# Patient Record
Sex: Female | Born: 1960 | Race: White | Hispanic: No | Marital: Single | Smoking: Former smoker
Health system: Southern US, Community
[De-identification: ages and names within clinical notes are randomized; demographics above are authoritative.]

## PROBLEM LIST (undated history)

## (undated) DIAGNOSIS — K219 Gastro-esophageal reflux disease without esophagitis: Secondary | ICD-10-CM

## (undated) DIAGNOSIS — M5020 Other cervical disc displacement, unspecified cervical region: Secondary | ICD-10-CM

## (undated) DIAGNOSIS — R0600 Dyspnea, unspecified: Secondary | ICD-10-CM

## (undated) DIAGNOSIS — F411 Generalized anxiety disorder: Secondary | ICD-10-CM

## (undated) DIAGNOSIS — M5030 Other cervical disc degeneration, unspecified cervical region: Secondary | ICD-10-CM

## (undated) DIAGNOSIS — T4145XA Adverse effect of unspecified anesthetic, initial encounter: Secondary | ICD-10-CM

## (undated) DIAGNOSIS — J449 Chronic obstructive pulmonary disease, unspecified: Secondary | ICD-10-CM

## (undated) DIAGNOSIS — T8859XA Other complications of anesthesia, initial encounter: Secondary | ICD-10-CM

## (undated) DIAGNOSIS — F324 Major depressive disorder, single episode, in partial remission: Secondary | ICD-10-CM

## (undated) DIAGNOSIS — A159 Respiratory tuberculosis unspecified: Secondary | ICD-10-CM

## (undated) DIAGNOSIS — R519 Headache, unspecified: Secondary | ICD-10-CM

## (undated) HISTORY — DX: Major depressive disorder, single episode, in partial remission: F32.4

## (undated) HISTORY — PX: TONSILLECTOMY: SUR1361

---
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
## 1999-02-19 HISTORY — PX: TOTAL ABDOMINAL HYSTERECTOMY: SHX209

## 1999-02-19 HISTORY — PX: PARTIAL HYSTERECTOMY: SHX80

## 2004-11-15 ENCOUNTER — Other Ambulatory Visit: Payer: Self-pay

## 2004-11-15 ENCOUNTER — Emergency Department: Payer: Self-pay | Admitting: Internal Medicine

## 2004-11-22 ENCOUNTER — Ambulatory Visit: Payer: Self-pay | Admitting: Cardiovascular Disease

## 2010-02-18 HISTORY — PX: NEPHROLITHOTOMY: SUR881

## 2010-08-23 ENCOUNTER — Ambulatory Visit: Payer: Self-pay | Admitting: Internal Medicine

## 2010-08-28 ENCOUNTER — Ambulatory Visit: Payer: Self-pay | Admitting: Urology

## 2010-09-10 ENCOUNTER — Other Ambulatory Visit: Payer: Self-pay | Admitting: Urology

## 2010-09-10 DIAGNOSIS — N200 Calculus of kidney: Secondary | ICD-10-CM

## 2010-10-01 ENCOUNTER — Encounter (HOSPITAL_COMMUNITY): Payer: 59

## 2010-10-01 ENCOUNTER — Other Ambulatory Visit: Payer: Self-pay | Admitting: Urology

## 2010-10-01 LAB — SURGICAL PCR SCREEN
MRSA, PCR: NEGATIVE
Staphylococcus aureus: NEGATIVE

## 2010-10-01 LAB — CBC
HCT: 38.3 % (ref 36.0–46.0)
Hemoglobin: 12.7 g/dL (ref 12.0–15.0)
MCH: 31.8 pg (ref 26.0–34.0)
MCHC: 33.2 g/dL (ref 30.0–36.0)
RBC: 4 MIL/uL (ref 3.87–5.11)

## 2010-10-08 ENCOUNTER — Ambulatory Visit (HOSPITAL_COMMUNITY): Payer: 59

## 2010-10-08 ENCOUNTER — Ambulatory Visit (HOSPITAL_COMMUNITY)
Admission: RE | Admit: 2010-10-08 | Discharge: 2010-10-08 | Disposition: A | Discharge status: Home / Self Care | Payer: 59 | Source: Ambulatory Visit | Attending: Urology | Admitting: Urology

## 2010-10-08 ENCOUNTER — Observation Stay (HOSPITAL_COMMUNITY)
Admission: AD | Admit: 2010-10-08 | Discharge: 2010-10-10 | Disposition: A | Discharge status: Home / Self Care | Payer: 59 | Source: Ambulatory Visit | Attending: Urology | Admitting: Urology

## 2010-10-08 DIAGNOSIS — Z79899 Other long term (current) drug therapy: Secondary | ICD-10-CM | POA: Insufficient documentation

## 2010-10-08 DIAGNOSIS — R109 Unspecified abdominal pain: Secondary | ICD-10-CM | POA: Insufficient documentation

## 2010-10-08 DIAGNOSIS — N200 Calculus of kidney: Secondary | ICD-10-CM

## 2010-10-08 DIAGNOSIS — Z01812 Encounter for preprocedural laboratory examination: Secondary | ICD-10-CM | POA: Insufficient documentation

## 2010-10-08 DIAGNOSIS — M545 Low back pain, unspecified: Secondary | ICD-10-CM | POA: Insufficient documentation

## 2010-10-08 MED ORDER — IOHEXOL 300 MG/ML  SOLN
50.0000 mL | Freq: Once | INTRAMUSCULAR | Status: AC | PRN
  Administered 2010-10-08: 15 mL via INTRATHECAL

## 2010-10-09 ENCOUNTER — Observation Stay (HOSPITAL_COMMUNITY): Payer: 59

## 2010-10-09 LAB — CBC
HCT: 35.8 % — ABNORMAL LOW (ref 36.0–46.0)
Hemoglobin: 12 g/dL (ref 12.0–15.0)
MCV: 95.2 fL (ref 78.0–100.0)
RBC: 3.76 MIL/uL — ABNORMAL LOW (ref 3.87–5.11)
RDW: 12.9 % (ref 11.5–15.5)
WBC: 13.4 10*3/uL — ABNORMAL HIGH (ref 4.0–10.5)

## 2010-10-09 LAB — BASIC METABOLIC PANEL
BUN: 8 mg/dL (ref 6–23)
CO2: 26 mEq/L (ref 19–32)
Chloride: 100 mEq/L (ref 96–112)
GFR calc Af Amer: >60 mL/min (ref >60)
Glucose, Bld: 145 mg/dL — ABNORMAL HIGH (ref 70–99)
Potassium: 4 mEq/L (ref 3.5–5.1)

## 2010-10-10 NOTE — Op Note (Signed)
NAMESAANYA, Denise Fernandez NO.:  [id number]  MEDICAL RECORD NO.:  [id number]  LOCATION:  1416                         FACILITY:  [facility]  PHYSICIAN:  Valetta Fuller, MD    DATE OF BIRTH:  02-07-1961  DATE OF PROCEDURE: DATE OF DISCHARGE:                              OPERATIVE REPORT   PREOPERATIVE DIAGNOSIS:  Left partial staghorn calculus.  POSTOPERATIVE DIAGNOSIS:  Left partial staghorn calculus.  PROCEDURE PERFORMED:  Left percutaneous nephrolithotomy.  SURGEON:  Valetta Fuller, MD  ASSISTANT:  Sharen Counter, MD from Interventional Radiology.  ANESTHESIA:  General endotracheal.  INDICATIONS:  Denise Fernandez is 50 years of age.  She was sent to [place] from Dr. Rosezetta Schlatter in [place], [place].  The patient had presented with some chronic intermittent left flank pain.  Evaluation in [place] revealed a partial staghorn calculus measuring, 1.3 x 3.0 cm and a secondary 1.3 cm stone.  Dr. Leonette Monarch felt that stone burden was accepted for lithotripsy and recommended percutaneous nephrolithotomy. For that reason, she was sent to our office.  She was initially evaluated by Jetta Lout, NP, and subsequently saw myself in consultation.  We concurred with the recommendation for a percutaneous approach given the stone burden.  We went over the pros and cons of this approach versus alternatives with the patient and she appeared to understand the advantages and disadvantages, potential risks.  She has been on suppressive therapy with ciprofloxacin and has been covered with perioperative antibiotics.  She has had placement of PAS compression boots.  Earlier today, the patient underwent successful left percutaneous nephrostomy tube placement by Interventional Radiology.  I have reviewed those accessed films.  The patient now presents for definitive surgery.  TECHNIQUE AND FINDINGS:  The patient was brought to the operating room where she had successful induction of  general endotracheal anesthesia. Foley catheter was placed sterilely on the field.  The patient was placed in prone position with all extremities carefully padded.  She was then prepped and draped in the usual manner.  Interventional Radiology was present initially.  Two wires were placed through the patient's percutaneous nephrostomy tube and directed to the bladder.  One was a working wire and one was a Museum/gallery conservator.  A fascial dilating balloon was utilized and placement of a renal sheath occurred without incident.  A large stone was encountered in the upper pole calix.  The stone appeared to be probably struvite in composition.  A combination ultrasonic and mechanical lithotriptor was used through the rigid nephroscope.  The stone was fractured into multitude of pieces.  The majority of stone material was suctioned out, drained around the scope through the sheath. Some larger fragments were removed with a three-pronged extractor.  The additional stone was encountered and removed as well.  At the completion of the procedure, we could see no obvious remaining residual stone fragments either visually or with fluoroscopic interpretation.  There were no obvious complications or problems.  Over one of the guidewires, an 18-French Councill Foley was placed as a nephrostomy tube.  Contrast was injected, it could be seen going down the ureter, indicating no obvious obstruction at the UPJ or  proximal ureter.  The nephrostomy tube was secured to the skin and hooked to drainage bag. The patient had no obvious complications or problems and was brought to recovery room in stable condition.     Valetta Fuller, MD     DSG/MEDQ  D:  10/08/2010  T:  10/09/2010  Job:  [id number]  cc:   Dr. Rich Brave, [facility]  Electronically Signed by Barron Alvine M.D. on 10/10/2010 08:41:44 AM

## 2010-10-15 NOTE — Discharge Summary (Signed)
  NAMEBRIAR, SWORD NO.:  [id number]  MEDICAL RECORD NO.:  [id number]  LOCATION:  1416                         FACILITY:  [facility]  PHYSICIAN:  Valetta Fuller, MD    DATE OF BIRTH:  [birth year]  DATE OF ADMISSION:  10/08/2010 DATE OF DISCHARGE:  10/10/2010                              DISCHARGE SUMMARY   DISCHARGE DIAGNOSIS:  Partial staghorn calculus left kidney.  PROCEDURE PERFORMED:  Percutaneous nephrolithotomy.  HISTORY OF PRESENT ILLNESS:  Ms. Pitones is a 50 years of age.  She had presented to an outside urologist with diagnosis of multiple left renal calculi.  She had an essence of partial staghorn stone with a total stone burden approximately 2 x 4 cm.  That physician recommended percutaneous nephrolithotomy and sent the patient to our practice for consideration of that procedure.  We felt that was appropriate treatment for her and full informed consent was obtained.  The patient subsequently came in the morning of October 08, 2010, to Interventional Radiology where uneventful insertion of a left nephrostomy tube was placed.  The patient was subsequently taken to the OR for formal percutaneous nephrolithotomy.  Again, multiple stones were encountered and it appeared that the stone had been completely removed.  No obvious intraoperative complications occurred.  The patient's postoperative course was otherwise unremarkable.  The patient had a CT scan the following morning, which showed some very tiny 1 mm fragments without evidence of urinoma or significant renal hematoma.  The patient's hemoglobin was stable status post procedure of 12.0.  Renal function was normal with a creatinine of 0.6.  The patient was having a moderate amount of flank pain and therefore, we elected to keep her additional night mostly for observation and pain management. On postoperative day #2, she was feeling better remained afebrile with stable vital signs.  She was voiding  well with the Foley removed.  Her nephrostomy tube was draining a light-to-medium pink urine.  Her nephrostomy tube was removed and dressings applied.  The patient will be discharged this afternoon.  DISPOSITION:  The patient will be discharged home.  There will be no change in her home medications.  Followup will be scheduled in our office for approximately 10-14 days and sooner if necessary.     Valetta Fuller, MD     DSG/MEDQ  D:  10/10/2010  T:  10/10/2010  Job:  [id number]  Electronically Signed by Barron Alvine M.D. on 10/15/2010 11:57:44 AM

## 2012-02-07 ENCOUNTER — Ambulatory Visit: Payer: Self-pay | Admitting: Emergency Medicine

## 2012-02-07 LAB — RAPID INFLUENZA A&B ANTIGENS

## 2012-12-02 ENCOUNTER — Ambulatory Visit: Payer: Self-pay | Admitting: Family Medicine

## 2013-01-07 ENCOUNTER — Ambulatory Visit: Payer: Self-pay | Admitting: Family Medicine

## 2013-06-18 IMAGING — CR DG ABDOMEN 1V
1 series · 2 of 2 positions shown · non-contrast
Comparison: none

REASON FOR EXAM: calculus of kidney - Send film with patient
COMMENTS:

[Series 1: view not recorded · 0.17mm/px · 2 of 2 slices shown]
[im 1/2]
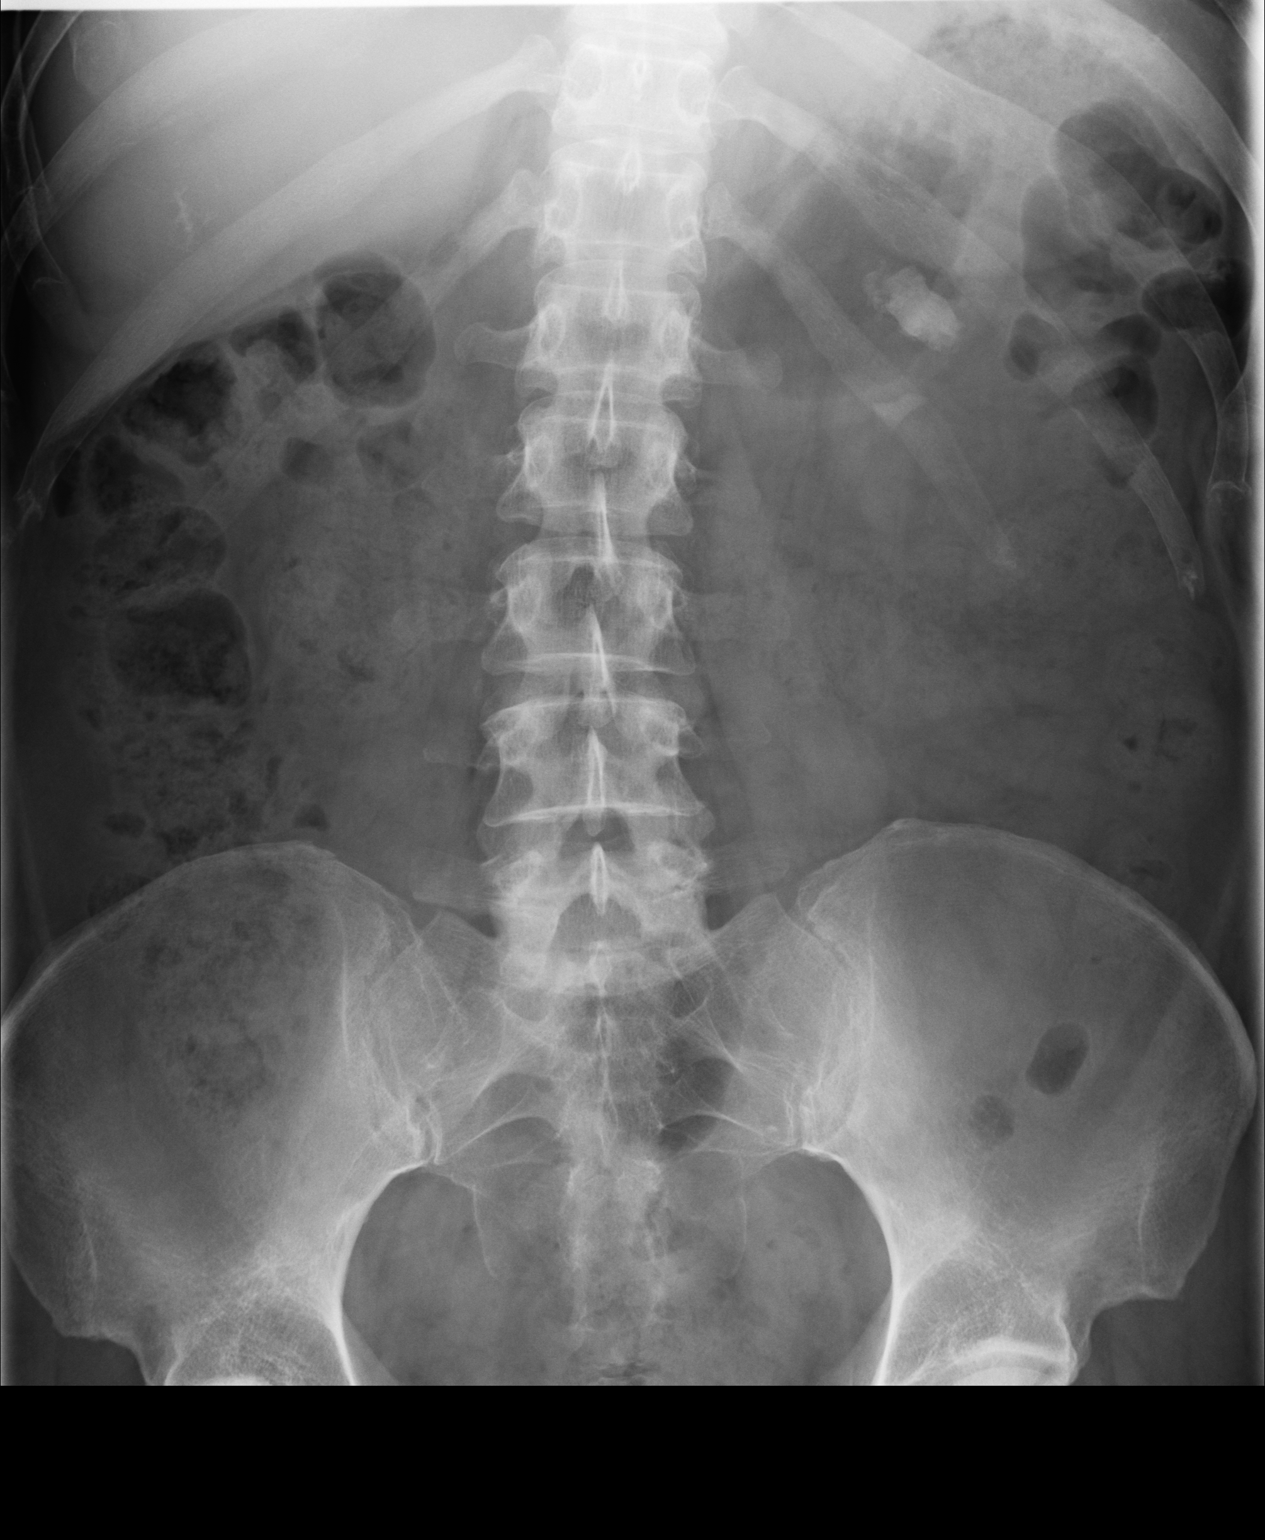
[im 2/2]
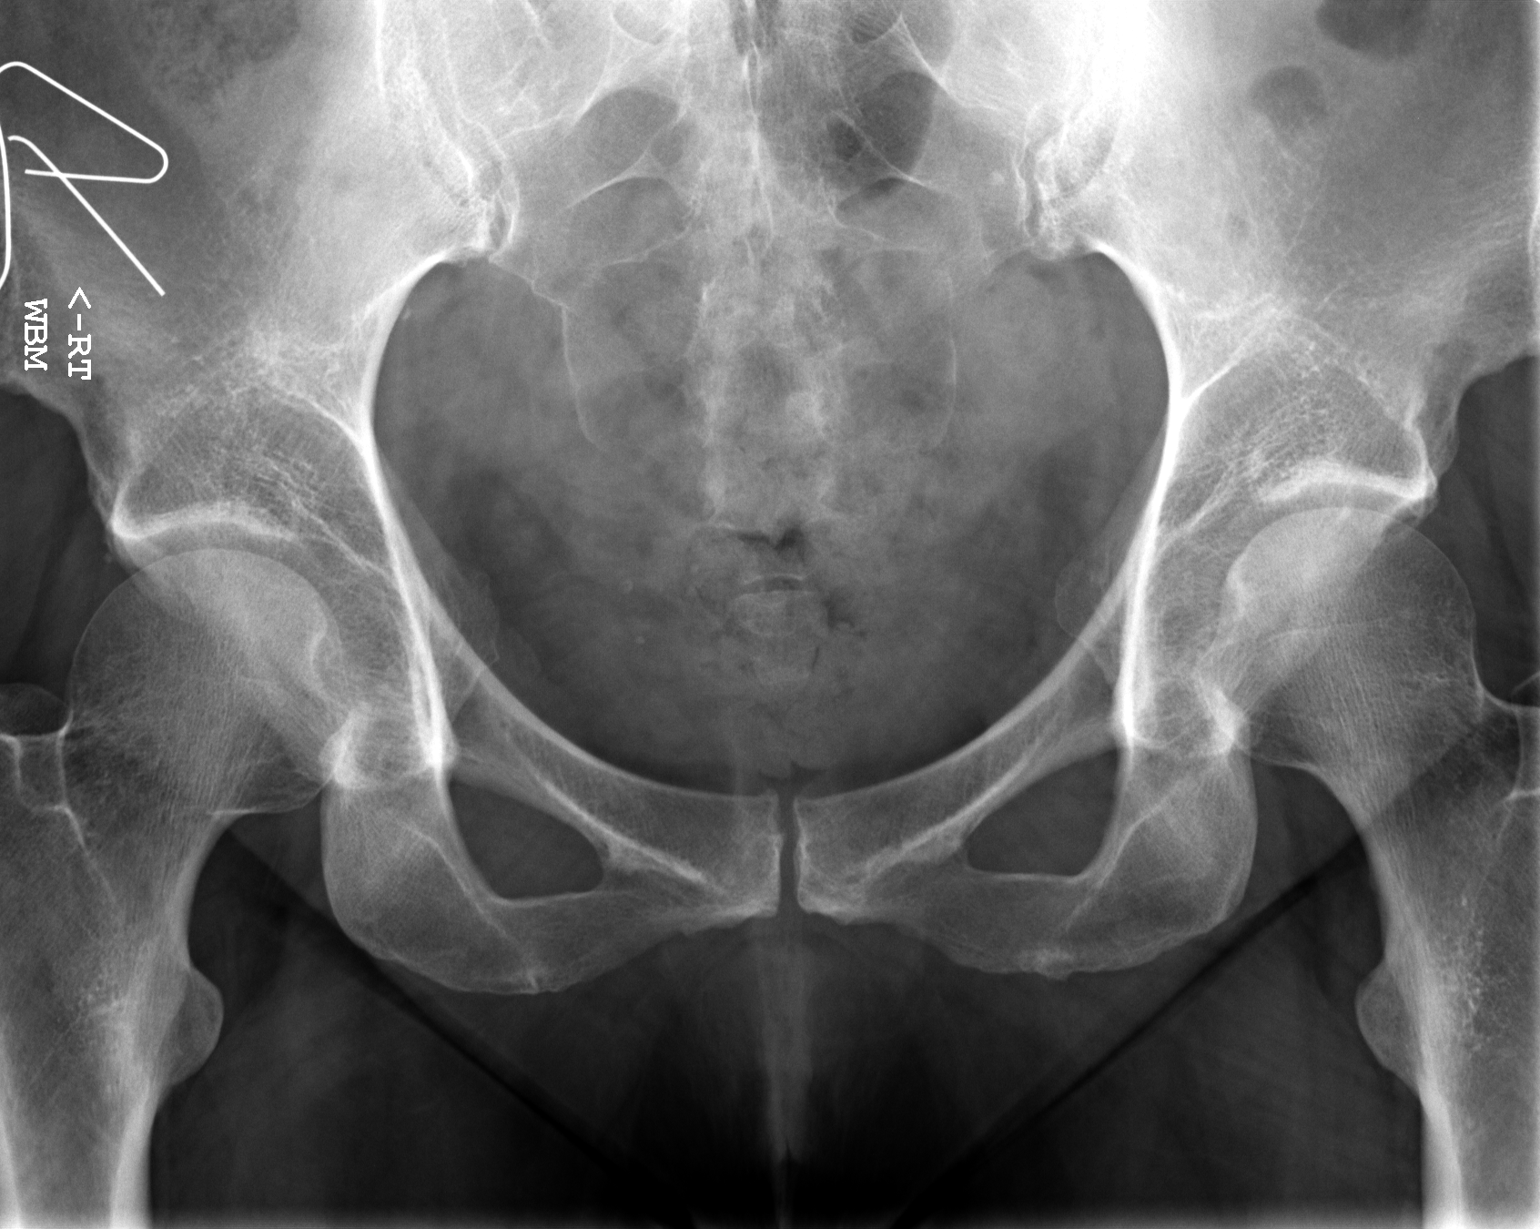

[2 of 2 positions shown; findings below may reference images not displayed]

PROCEDURE:     MDR - [facility]ADDER  - August 28, 2010  [DATE]

RESULT:     There is a 3 cm x 1.3 cm calcification at the midpole of the
left kidney consistent with a staghorn type calcification. At the lower
pole, there is a 1.3 cm x 6.5 mm calcification on the left. No definite
right renal stones are seen. No ureteral calcifications are identified on
either side.
IMPRESSION: Left nephrolithiasis.

## 2013-11-18 LAB — LIPID PANEL
Cholesterol: 191 mg/dL (ref 0–200)
HDL: 46 mg/dL (ref 35–70)
LDL CALC: 104 mg/dL
Triglycerides: 203 mg/dL — AB (ref 40–160)

## 2013-12-03 ENCOUNTER — Ambulatory Visit: Payer: Self-pay | Admitting: Internal Medicine

## 2013-12-03 LAB — HM MAMMOGRAPHY

## 2013-12-27 ENCOUNTER — Ambulatory Visit: Payer: Self-pay | Admitting: Gastroenterology

## 2015-01-10 ENCOUNTER — Other Ambulatory Visit: Payer: Self-pay

## 2015-01-10 DIAGNOSIS — F329 Major depressive disorder, single episode, unspecified: Secondary | ICD-10-CM | POA: Insufficient documentation

## 2015-01-10 DIAGNOSIS — N6019 Diffuse cystic mastopathy of unspecified breast: Secondary | ICD-10-CM | POA: Insufficient documentation

## 2015-01-10 DIAGNOSIS — R8271 Bacteriuria: Secondary | ICD-10-CM | POA: Insufficient documentation

## 2015-01-10 MED ORDER — ALPRAZOLAM 1 MG PO TABS: 1.0000 mg | ORAL_TABLET | Freq: Every day | ORAL | Status: DC | PRN

## 2015-01-18 ENCOUNTER — Encounter: Payer: Self-pay | Admitting: Internal Medicine

## 2015-01-18 ENCOUNTER — Ambulatory Visit (INDEPENDENT_AMBULATORY_CARE_PROVIDER_SITE_OTHER): Payer: 59 | Admitting: Internal Medicine

## 2015-01-18 VITALS — BP 132/72 | HR 84 | Ht 67.0 in | Wt 179.2 lb

## 2015-01-18 DIAGNOSIS — Z1239 Encounter for other screening for malignant neoplasm of breast: Secondary | ICD-10-CM

## 2015-01-18 DIAGNOSIS — F411 Generalized anxiety disorder: Secondary | ICD-10-CM | POA: Insufficient documentation

## 2015-01-18 DIAGNOSIS — F419 Anxiety disorder, unspecified: Secondary | ICD-10-CM | POA: Insufficient documentation

## 2015-01-18 DIAGNOSIS — F324 Major depressive disorder, single episode, in partial remission: Secondary | ICD-10-CM

## 2015-01-18 HISTORY — DX: Major depressive disorder, single episode, in partial remission: F32.4

## 2015-01-18 MED ORDER — ALPRAZOLAM 1 MG PO TABS: 1.0000 mg | ORAL_TABLET | Freq: Every day | ORAL | Status: DC | PRN

## 2015-01-18 NOTE — Progress Notes (Signed)
Date:  01/18/2015   Name:  Denise Fernandez   DOB:  May 02, 1960   MRN:  [id number]   Chief Complaint: Anxiety  Patient reports doing fairly well. In the past year she's remained off of antidepressants and doesn't feel especially depressed. She had a long difficult time weaning off of Effexor. She continues to work every day feels much more alert but at times has anxiety and increased periods of stress. She also has episodes of insomnia due to anxiety. She uses alprazolam intermittently - sometimes to help with sleep and sometimes during the day if she is especially anxious at work. She has developed other coping mechanisms other than medications. She also quit smoking in January of this year is very proud of that fact.  Review of Systems  Constitutional: Negative for fatigue and unexpected weight change.  HENT: Negative for tinnitus and voice change.   Respiratory: Negative for cough, chest tightness and shortness of breath.   Cardiovascular: Negative for chest pain and palpitations.  Gastrointestinal: Negative for abdominal pain, diarrhea and constipation.  Neurological: Negative for dizziness and headaches.  Psychiatric/Behavioral: Positive for sleep disturbance. Negative for suicidal ideas, dysphoric mood and decreased concentration. The patient is nervous/anxious.     Patient Active Problem List   Diagnosis Date Noted  . Asymptomatic bacteriuria 01/10/2015  . Fibrocystic breast 01/10/2015  . Major depressive disorder with single episode ([place]) 01/10/2015    Prior to Admission medications   Medication Sig Start Date End Date Taking? Authorizing Provider  ALPRAZolam Duanne Moron) 1 MG tablet Take 1 tablet (1 mg total) by mouth daily as needed for anxiety. 01/10/15  Yes Glean Hess, MD  nystatin ointment (MYCOSTATIN) NYSTATIN, 100000 UNIT/GM (External Ointment)  1 (one) application application two times daily for 30 days  Quantity: 30;  Refills: 2   Ordered :11-August-2013  Alison Stalling ;  Started 11-August-2013 Active 08/11/13   Historical Provider, MD    No Known Allergies  Past Surgical History  Procedure Laterality Date  . Nephrolithotomy  2012    Dr. Marcelline Mates  . Abdominal hysterectomy    . Tonsillectomy      Social History  Substance Use Topics  . Smoking status: Current Every Day Smoker  . Smokeless tobacco: None  . Alcohol Use: 0.0 oz/week    0 Standard drinks or equivalent per week     Medication list has been reviewed and updated.  Physical Exam  Constitutional: She is oriented to person, place, and time. She appears well-developed and well-nourished. No distress.  HENT:  Head: Normocephalic and atraumatic.  Eyes: Conjunctivae are normal. Right eye exhibits no discharge. Left eye exhibits no discharge. No scleral icterus.  Neck: Normal range of motion. Neck supple. No thyromegaly present.  Cardiovascular: Normal rate, regular rhythm and normal heart sounds.   Pulmonary/Chest: Effort normal and breath sounds normal. No respiratory distress. She has no wheezes.  Musculoskeletal: Normal range of motion. She exhibits no edema or tenderness.  Neurological: She is alert and oriented to person, place, and time.  Skin: Skin is warm and dry. No rash noted.  Psychiatric: She has a normal mood and affect. Her behavior is normal. Thought content normal.    BP 132/72 mmHg  Pulse 84  Ht 5\' 7"  (1.702 m)  Wt 179 lb 3.2 oz (81.285 kg)  BMI 28.06 kg/m2  Assessment and Plan: 1. Generalized anxiety disorder Stable intermittent symptoms responding well to when necessary alprazolam  2. Major depressive disorder with single episode, in partial remission (  [place]) Will not begin antidepressants or anxiety medications on a daily basis at this time Patient will monitor for worsening of symptoms and return if needed - ALPRAZolam (XANAX) 1 MG tablet; Take 1 tablet (1 mg total) by mouth daily as needed for anxiety.  Dispense: 30 tablet; Refill: 2  3. Breast  cancer screening - MM DIGITAL SCREENING BILATERAL; Future   Halina Maidens, MD [facility]  01/18/2015

## 2015-01-18 NOTE — Patient Instructions (Signed)
Breast Self-Awareness Practicing breast self-awareness may pick up problems early, prevent significant medical complications, and possibly save your life. By practicing breast self-awareness, you can become familiar with how your breasts look and feel and if your breasts are changing. This allows you to notice changes early. It can also offer you some reassurance that your breast health is good. One way to learn what is normal for your breasts and whether your breasts are changing is to do a breast self-exam. If you find a lump or something that was not present in the past, it is best to contact your caregiver right away. Other findings that should be evaluated by your caregiver include nipple discharge, especially if it is bloody; skin changes or reddening; areas where the skin seems to be pulled in (retracted); or new lumps and bumps. Breast pain is seldom associated with cancer (malignancy), but should also be evaluated by a caregiver. HOW TO PERFORM A BREAST SELF-EXAM The best time to examine your breasts is 5-7 days after your menstrual period is over. During menstruation, the breasts are lumpier, and it may be more difficult to pick up changes. If you do not menstruate, have reached menopause, or had your uterus removed (hysterectomy), you should examine your breasts at regular intervals, such as monthly. If you are breastfeeding, examine your breasts after a feeding or after using a breast pump. Breast implants do not decrease the risk for lumps or tumors, so continue to perform breast self-exams as recommended. Talk to your caregiver about how to determine the difference between the implant and breast tissue. Also, talk about the amount of pressure you should use during the exam. Over time, you will become more familiar with the variations of your breasts and more comfortable with the exam. A breast self-exam requires you to remove all your clothes above the waist. 1. Look at your breasts and nipples.  Stand in front of a mirror in a room with good lighting. With your hands on your hips, push your hands firmly downward. Look for a difference in shape, contour, and size from one breast to the other (asymmetry). Asymmetry includes puckers, dips, or bumps. Also, look for skin changes, such as reddened or scaly areas on the breasts. Look for nipple changes, such as discharge, dimpling, repositioning, or redness. 2. Carefully feel your breasts. This is best done either in the shower or tub while using soapy water or when flat on your back. Place the arm (on the side of the breast you are examining) above your head. Use the pads (not the fingertips) of your three middle fingers on your opposite hand to feel your breasts. Start in the underarm area and use  inch (2 cm) overlapping circles to feel your breast. Use 3 different levels of pressure (light, medium, and firm pressure) at each circle before moving to the next circle. The light pressure is needed to feel the tissue closest to the skin. The medium pressure will help to feel breast tissue a little deeper, while the firm pressure is needed to feel the tissue close to the ribs. Continue the overlapping circles, moving downward over the breast until you feel your ribs below your breast. Then, move one finger-width towards the center of the body. Continue to use the  inch (2 cm) overlapping circles to feel your breast as you move slowly up toward the collar bone (clavicle) near the base of the neck. Continue the up and down exam using all 3 pressures until you reach the   middle of the chest. Do this with each breast, carefully feeling for lumps or changes. 3.  Keep a written record with breast changes or normal findings for each breast. By writing this information down, you do not need to depend only on memory for size, tenderness, or location. Write down where you are in your menstrual cycle, if you are still menstruating. Breast tissue can have some lumps or  thick tissue. However, see your caregiver if you find anything that concerns you.  SEEK MEDICAL CARE IF:  You see a change in shape, contour, or size of your breasts or nipples.   You see skin changes, such as reddened or scaly areas on the breasts or nipples.   You have an unusual discharge from your nipples.   You feel a new lump or unusually thick areas.    This information is not intended to replace advice given to you by your health care provider. Make sure you discuss any questions you have with your health care provider.   Document Released: 02/04/2005 Document Revised: 01/22/2012 Document Reviewed: 05/22/2011 Elsevier Interactive Patient Education 2016 Elsevier Inc.  

## 2015-01-26 ENCOUNTER — Ambulatory Visit
Admission: RE | Admit: 2015-01-26 | Discharge: 2015-01-26 | Disposition: A | Discharge status: Home / Self Care | Payer: 59 | Source: Ambulatory Visit | Attending: Internal Medicine | Admitting: Internal Medicine

## 2015-01-26 DIAGNOSIS — Z1231 Encounter for screening mammogram for malignant neoplasm of breast: Secondary | ICD-10-CM | POA: Diagnosis not present

## 2015-01-26 DIAGNOSIS — Z1239 Encounter for other screening for malignant neoplasm of breast: Secondary | ICD-10-CM

## 2015-01-27 ENCOUNTER — Other Ambulatory Visit: Payer: Self-pay | Admitting: Internal Medicine

## 2015-01-27 DIAGNOSIS — R928 Other abnormal and inconclusive findings on diagnostic imaging of breast: Secondary | ICD-10-CM

## 2015-01-30 ENCOUNTER — Encounter: Payer: Self-pay | Admitting: Internal Medicine

## 2015-01-30 ENCOUNTER — Ambulatory Visit (INDEPENDENT_AMBULATORY_CARE_PROVIDER_SITE_OTHER): Payer: 59 | Admitting: Internal Medicine

## 2015-01-30 VITALS — BP 132/80 | HR 86 | Temp 98.1°F | Ht 67.0 in | Wt 174.8 lb

## 2015-01-30 DIAGNOSIS — J40 Bronchitis, not specified as acute or chronic: Secondary | ICD-10-CM

## 2015-01-30 MED ORDER — AMOXICILLIN-POT CLAVULANATE 875-125 MG PO TABS: 1.0000 | ORAL_TABLET | Freq: Two times a day (BID) | ORAL | Status: DC

## 2015-01-30 MED ORDER — HYDROCODONE-HOMATROPINE 5-1.5 MG/5ML PO SYRP: 5.0000 mL | ORAL_SOLUTION | Freq: Four times a day (QID) | ORAL | Status: DC | PRN

## 2015-01-30 NOTE — Progress Notes (Signed)
Date:  01/30/2015   Name:  Denise Fernandez   DOB:  1960-04-04   MRN:  [id number]   Chief Complaint: Bronchitis Cough This is a new problem. The current episode started in the past 7 days. The problem has been gradually worsening. The problem occurs constantly. Associated symptoms include chills, ear pain and a fever. Pertinent negatives include no chest pain, postnasal drip, rash, sore throat or shortness of breath. She has tried OTC cough suppressant for the symptoms.    Review of Systems  Constitutional: Positive for fever and chills.  HENT: Positive for ear pain. Negative for postnasal drip and sore throat.   Respiratory: Positive for cough. Negative for shortness of breath.   Cardiovascular: Negative for chest pain, palpitations and leg swelling.  Gastrointestinal: Negative for abdominal pain.  Skin: Negative for rash.    Patient Active Problem List   Diagnosis Date Noted  . Major depressive disorder with single episode, in partial remission ([place]) 01/18/2015  . Generalized anxiety disorder 01/18/2015  . Asymptomatic bacteriuria 01/10/2015  . Fibrocystic breast 01/10/2015    Prior to Admission medications   Medication Sig Start Date End Date Taking? Authorizing Provider  ALPRAZolam Duanne Moron) 1 MG tablet Take 1 tablet (1 mg total) by mouth daily as needed for anxiety. 01/18/15  Yes Glean Hess, MD  nystatin ointment (MYCOSTATIN) NYSTATIN, 100000 UNIT/GM (External Ointment)  1 (one) application application two times daily for 30 days  Quantity: 30;  Refills: 2   Ordered :11-August-2013  Alison Stalling ;  Started 11-August-2013 Active 08/11/13  Yes Historical Provider, MD    No Known Allergies  Past Surgical History  Procedure Laterality Date  . Nephrolithotomy  2012    Dr. Marcelline Mates  . Abdominal hysterectomy    . Tonsillectomy      Social History  Substance Use Topics  . Smoking status: Former Smoker    Quit date: 02/18/2014  . Smokeless tobacco: None  .  Alcohol Use: 0.0 oz/week    0 Standard drinks or equivalent per week    Medication list has been reviewed and updated.   Physical Exam  Constitutional: She is oriented to person, place, and time. She appears well-developed and well-nourished.  HENT:  Right Ear: External ear and ear canal normal. Tympanic membrane is not erythematous and not retracted.  Left Ear: External ear and ear canal normal. Tympanic membrane is not erythematous and not retracted.  Nose: Right sinus exhibits no maxillary sinus tenderness and no frontal sinus tenderness. Left sinus exhibits no maxillary sinus tenderness and no frontal sinus tenderness.  Mouth/Throat: Uvula is midline and mucous membranes are normal. No oral lesions. Posterior oropharyngeal erythema present. No oropharyngeal exudate.  Cardiovascular: Normal rate, regular rhythm and normal heart sounds.   Pulmonary/Chest: She has decreased breath sounds in the right upper field and the left upper field. She has no wheezes. She has no rhonchi. She has no rales.  Lymphadenopathy:    She has no cervical adenopathy.  Neurological: She is alert and oriented to person, place, and time.  Nursing note and vitals reviewed.   BP 132/80 mmHg  Pulse 86  Temp(Src) 98.1 F (36.7 C)  Ht 5\' 7"  (1.702 m)  Wt 174 lb 12.8 oz (79.289 kg)  BMI 27.37 kg/m2  SpO2 96%  Assessment and Plan: 1. Bronchitis Adequate fluids and rest - amoxicillin-clavulanate (AUGMENTIN) 875-125 MG tablet; Take 1 tablet by mouth 2 (two) times daily.  Dispense: 20 tablet; Refill: 0 - HYDROcodone-homatropine (HYCODAN) 5-1.5 MG/5ML syrup;  Take 5 mLs by mouth every 6 (six) hours as needed for cough.  Dispense: 120 mL; Refill: 0   Halina Maidens, MD [facility]  01/30/2015

## 2015-01-31 ENCOUNTER — Other Ambulatory Visit: Payer: 59

## 2015-01-31 ENCOUNTER — Ambulatory Visit: Payer: 59

## 2015-02-07 ENCOUNTER — Ambulatory Visit
Admission: RE | Admit: 2015-02-07 | Discharge: 2015-02-07 | Disposition: A | Discharge status: Home / Self Care | Payer: 59 | Source: Ambulatory Visit | Attending: Internal Medicine | Admitting: Internal Medicine

## 2015-02-07 DIAGNOSIS — R928 Other abnormal and inconclusive findings on diagnostic imaging of breast: Secondary | ICD-10-CM | POA: Diagnosis not present

## 2015-04-17 ENCOUNTER — Other Ambulatory Visit: Payer: Self-pay | Admitting: Internal Medicine

## 2015-04-18 ENCOUNTER — Ambulatory Visit (INDEPENDENT_AMBULATORY_CARE_PROVIDER_SITE_OTHER): Payer: 59 | Admitting: Internal Medicine

## 2015-04-18 ENCOUNTER — Encounter: Payer: Self-pay | Admitting: Internal Medicine

## 2015-04-18 VITALS — BP 122/68 | HR 88 | Ht 67.0 in | Wt 177.4 lb

## 2015-04-18 DIAGNOSIS — Z1239 Encounter for other screening for malignant neoplasm of breast: Secondary | ICD-10-CM | POA: Diagnosis not present

## 2015-04-18 DIAGNOSIS — Z0000 Encounter for general adult medical examination without abnormal findings: Secondary | ICD-10-CM

## 2015-04-18 DIAGNOSIS — F324 Major depressive disorder, single episode, in partial remission: Secondary | ICD-10-CM | POA: Diagnosis not present

## 2015-04-18 DIAGNOSIS — Z1159 Encounter for screening for other viral diseases: Secondary | ICD-10-CM

## 2015-04-18 LAB — POCT URINALYSIS DIPSTICK
BILIRUBIN UA: NEGATIVE
GLUCOSE UA: NEGATIVE
Ketones, UA: NEGATIVE
LEUKOCYTES UA: NEGATIVE
NITRITE UA: NEGATIVE
Protein, UA: NEGATIVE
RBC UA: NEGATIVE
Spec Grav, UA: <=1.005
UROBILINOGEN UA: 0.2
pH, UA: 7.5

## 2015-04-18 NOTE — Progress Notes (Signed)
Date:  04/18/2015   Name:  Denise Fernandez   DOB:  1960-06-19   MRN:  [id number]   Chief Complaint: Annual Exam Denise Fernandez is a 55 y.o. female who presents today for her Complete Annual Exam. She feels fairly well. She reports exercising walking regularly. She reports she is sleeping well.   Anxiety Presents for follow-up visit. Symptoms include nervous/anxious behavior. Patient reports no chest pain, decreased concentration, dizziness, palpitations or shortness of breath. Symptoms occur occasionally.      Review of Systems  Constitutional: Negative for fever, chills and fatigue.  HENT: Negative for congestion, sinus pressure, tinnitus, trouble swallowing and voice change.   Respiratory: Negative for cough, choking, shortness of breath and wheezing.   Cardiovascular: Negative for chest pain, palpitations and leg swelling.  Gastrointestinal: Negative for abdominal pain, diarrhea, constipation and blood in stool.  Genitourinary: Negative for dysuria, vaginal bleeding, vaginal discharge and pelvic pain.  Musculoskeletal: Positive for myalgias. Negative for back pain, joint swelling, arthralgias and gait problem.  Skin: Negative for color change and rash.  Neurological: Negative for dizziness, light-headedness, numbness and headaches.  Psychiatric/Behavioral: Positive for dysphoric mood. Negative for decreased concentration. The patient is nervous/anxious.     Patient Active Problem List   Diagnosis Date Noted  . Major depressive disorder with single episode, in partial remission ([place]) 01/18/2015  . Generalized anxiety disorder 01/18/2015  . Asymptomatic bacteriuria 01/10/2015  . Fibrocystic breast 01/10/2015    Prior to Admission medications   Medication Sig Start Date End Date Taking? Authorizing Provider  ALPRAZolam Duanne Moron) 1 MG tablet Take 1 tablet (1 mg total) by mouth daily as needed for anxiety. 01/18/15  Yes Glean Hess, MD    No Known  Allergies  Past Surgical History  Procedure Laterality Date  . Nephrolithotomy  2012    Dr. Marcelline Mates  . Partial hysterectomy  2001    Patient states that she has ovaries  . Tonsillectomy      Social History  Substance Use Topics  . Smoking status: Former Smoker    Quit date: 02/18/2014  . Smokeless tobacco: None  . Alcohol Use: 0.0 oz/week    0 Standard drinks or equivalent per week     Comment: occasional     Medication list has been reviewed and updated.   Physical Exam  Constitutional: She is oriented to person, place, and time. She appears well-developed and well-nourished. No distress.  HENT:  Head: Normocephalic and atraumatic.  Neck: Normal range of motion. Neck supple. No thyromegaly present.  Cardiovascular: Normal rate, regular rhythm and normal heart sounds.   Pulmonary/Chest: Effort normal and breath sounds normal. No respiratory distress. She has no wheezes. She has no rales.  Abdominal: Soft. Bowel sounds are normal. She exhibits no mass. There is no tenderness. There is no rebound and no guarding.  Musculoskeletal: Normal range of motion.  Neurological: She is alert and oriented to person, place, and time.  Skin: Skin is warm and dry. No rash noted.  Psychiatric: She has a normal mood and affect. Her behavior is normal. Thought content normal. Her mood appears not anxious. She does not exhibit a depressed mood.  Nursing note and vitals reviewed.   BP 122/68 mmHg  Pulse 88  Ht 5\' 7"  (1.702 m)  Wt 177 lb 6.4 oz (80.468 kg)  BMI 27.78 kg/m2  Assessment and Plan: 1. Annual physical exam - POCT Urinalysis Dipstick - CBC with Differential/Platelet - Comprehensive metabolic panel - Lipid  panel  2. Major depressive disorder with single episode, in partial remission ([place]) Doing well off of anti-depressant medication Continue xanax as needed - TSH  3. Need for hepatitis C screening test - Hepatitis C antibody  4. Breast cancer screening Pt  will schedule annually - MM DIGITAL SCREENING BILATERAL; Future   Halina Maidens, MD [facility]  04/18/2015

## 2015-04-18 NOTE — Patient Instructions (Signed)
Breast Self-Awareness Practicing breast self-awareness may pick up problems early, prevent significant medical complications, and possibly save your life. By practicing breast self-awareness, you can become familiar with how your breasts look and feel and if your breasts are changing. This allows you to notice changes early. It can also offer you some reassurance that your breast health is good. One way to learn what is normal for your breasts and whether your breasts are changing is to do a breast self-exam. If you find a lump or something that was not present in the past, it is best to contact your caregiver right away. Other findings that should be evaluated by your caregiver include nipple discharge, especially if it is bloody; skin changes or reddening; areas where the skin seems to be pulled in (retracted); or new lumps and bumps. Breast pain is seldom associated with cancer (malignancy), but should also be evaluated by a caregiver. HOW TO PERFORM A BREAST SELF-EXAM The best time to examine your breasts is 5-7 days after your menstrual period is over. During menstruation, the breasts are lumpier, and it may be more difficult to pick up changes. If you do not menstruate, have reached menopause, or had your uterus removed (hysterectomy), you should examine your breasts at regular intervals, such as monthly. If you are breastfeeding, examine your breasts after a feeding or after using a breast pump. Breast implants do not decrease the risk for lumps or tumors, so continue to perform breast self-exams as recommended. Talk to your caregiver about how to determine the difference between the implant and breast tissue. Also, talk about the amount of pressure you should use during the exam. Over time, you will become more familiar with the variations of your breasts and more comfortable with the exam. A breast self-exam requires you to remove all your clothes above the waist. 1. Look at your breasts and nipples.  Stand in front of a mirror in a room with good lighting. With your hands on your hips, push your hands firmly downward. Look for a difference in shape, contour, and size from one breast to the other (asymmetry). Asymmetry includes puckers, dips, or bumps. Also, look for skin changes, such as reddened or scaly areas on the breasts. Look for nipple changes, such as discharge, dimpling, repositioning, or redness. 2. Carefully feel your breasts. This is best done either in the shower or tub while using soapy water or when flat on your back. Place the arm (on the side of the breast you are examining) above your head. Use the pads (not the fingertips) of your three middle fingers on your opposite hand to feel your breasts. Start in the underarm area and use  inch (2 cm) overlapping circles to feel your breast. Use 3 different levels of pressure (light, medium, and firm pressure) at each circle before moving to the next circle. The light pressure is needed to feel the tissue closest to the skin. The medium pressure will help to feel breast tissue a little deeper, while the firm pressure is needed to feel the tissue close to the ribs. Continue the overlapping circles, moving downward over the breast until you feel your ribs below your breast. Then, move one finger-width towards the center of the body. Continue to use the  inch (2 cm) overlapping circles to feel your breast as you move slowly up toward the collar bone (clavicle) near the base of the neck. Continue the up and down exam using all 3 pressures until you reach the   middle of the chest. Do this with each breast, carefully feeling for lumps or changes. 3.  Keep a written record with breast changes or normal findings for each breast. By writing this information down, you do not need to depend only on memory for size, tenderness, or location. Write down where you are in your menstrual cycle, if you are still menstruating. Breast tissue can have some lumps or  thick tissue. However, see your caregiver if you find anything that concerns you.  SEEK MEDICAL CARE IF:  You see a change in shape, contour, or size of your breasts or nipples.   You see skin changes, such as reddened or scaly areas on the breasts or nipples.   You have an unusual discharge from your nipples.   You feel a new lump or unusually thick areas.    This information is not intended to replace advice given to you by your health care provider. Make sure you discuss any questions you have with your health care provider.   Document Released: 02/04/2005 Document Revised: 01/22/2012 Document Reviewed: 05/22/2011 Elsevier Interactive Patient Education 2016 Elsevier Inc.  

## 2015-04-19 ENCOUNTER — Telehealth: Payer: Self-pay

## 2015-04-19 LAB — CBC WITH DIFFERENTIAL/PLATELET
BASOS ABS: 0 10*3/uL (ref 0.0–0.2)
Basos: 0 %
EOS (ABSOLUTE): 0.1 10*3/uL (ref 0.0–0.4)
Eos: 1 %
HEMOGLOBIN: 14.4 g/dL (ref 11.1–15.9)
Hematocrit: 42.5 % (ref 34.0–46.6)
IMMATURE GRANS (ABS): 0 10*3/uL (ref 0.0–0.1)
IMMATURE GRANULOCYTES: 0 %
LYMPHS: 37 %
Lymphocytes Absolute: 1.9 10*3/uL (ref 0.7–3.1)
MCH: 32.6 pg (ref 26.6–33.0)
MCHC: 33.9 g/dL (ref 31.5–35.7)
MCV: 96 fL (ref 79–97)
MONOCYTES: 7 %
Monocytes Absolute: 0.4 10*3/uL (ref 0.1–0.9)
NEUTROS PCT: 55 %
Neutrophils Absolute: 2.9 10*3/uL (ref 1.4–7.0)
Platelets: 303 10*3/uL (ref 150–379)
RBC: 4.42 x10E6/uL (ref 3.77–5.28)
RDW: 13.2 % (ref 12.3–15.4)
WBC: 5.3 10*3/uL (ref 3.4–10.8)

## 2015-04-19 LAB — LIPID PANEL
CHOL/HDL RATIO: 3.2 ratio (ref 0.0–4.4)
Cholesterol, Total: 191 mg/dL (ref 100–199)
HDL: 59 mg/dL (ref >39)
LDL CALC: 101 mg/dL — AB (ref 0–99)
Triglycerides: 156 mg/dL — ABNORMAL HIGH (ref 0–149)
VLDL Cholesterol Cal: 31 mg/dL (ref 5–40)

## 2015-04-19 LAB — COMPREHENSIVE METABOLIC PANEL
ALBUMIN: 4.5 g/dL (ref 3.5–5.5)
ALT: 31 IU/L (ref 0–32)
AST: 23 IU/L (ref 0–40)
Albumin/Globulin Ratio: 1.9 (ref 1.1–2.5)
Alkaline Phosphatase: 91 IU/L (ref 39–117)
BUN / CREAT RATIO: 15 (ref 9–23)
BUN: 12 mg/dL (ref 6–24)
Bilirubin Total: 0.3 mg/dL (ref 0.0–1.2)
CALCIUM: 9.7 mg/dL (ref 8.7–10.2)
CO2: 28 mmol/L (ref 18–29)
CREATININE: 0.8 mg/dL (ref 0.57–1.00)
Chloride: 99 mmol/L (ref 96–106)
GFR, EST AFRICAN AMERICAN: 97 mL/min/{1.73_m2} (ref >59)
GFR, EST NON AFRICAN AMERICAN: 84 mL/min/{1.73_m2} (ref >59)
GLOBULIN, TOTAL: 2.4 g/dL (ref 1.5–4.5)
Glucose: 78 mg/dL (ref 65–99)
Potassium: 4.4 mmol/L (ref 3.5–5.2)
SODIUM: 139 mmol/L (ref 134–144)
Total Protein: 6.9 g/dL (ref 6.0–8.5)

## 2015-04-19 LAB — HEPATITIS C ANTIBODY

## 2015-04-19 LAB — TSH: TSH: 2.02 u[IU]/mL (ref 0.450–4.500)

## 2015-04-19 NOTE — Telephone Encounter (Signed)
-----   Message from Glean Hess, MD sent at 04/19/2015  7:55 AM EST ----- Labs are all good.  Cholesterol is good.  Hep C is negative.

## 2015-04-19 NOTE — Telephone Encounter (Signed)
Left message for patient to call back  

## 2015-04-19 NOTE — Telephone Encounter (Signed)
Spoke with patient. Patient advised of all results and verbalized understanding. Will call back with any future questions or concerns. [facility]  

## 2015-06-29 ENCOUNTER — Other Ambulatory Visit: Payer: Self-pay | Admitting: Internal Medicine

## 2015-06-29 ENCOUNTER — Telehealth: Payer: Self-pay

## 2015-06-29 NOTE — Telephone Encounter (Signed)
Would like refill on Anxiety Rx. Left VM. [facility]

## 2015-08-25 ENCOUNTER — Other Ambulatory Visit: Payer: Self-pay | Admitting: Internal Medicine

## 2015-08-25 ENCOUNTER — Telehealth: Payer: Self-pay

## 2015-08-25 MED ORDER — ALPRAZOLAM 1 MG PO TABS: 1.0000 mg | ORAL_TABLET | Freq: Two times a day (BID) | ORAL | Status: DC | PRN

## 2015-08-25 NOTE — Telephone Encounter (Signed)
No PA required just needs refill on Xanax sent to [facility] [place].

## 2015-08-25 NOTE — Telephone Encounter (Signed)
No need for Prior Auth. Patient just needs refill for Alpral

## 2015-11-10 DIAGNOSIS — Z23 Encounter for immunization: Secondary | ICD-10-CM | POA: Diagnosis not present

## 2015-12-18 ENCOUNTER — Other Ambulatory Visit: Payer: Self-pay | Admitting: Internal Medicine

## 2015-12-18 MED ORDER — ALPRAZOLAM 1 MG PO TABS: 1.0000 mg | ORAL_TABLET | Freq: Two times a day (BID) | ORAL | 3 refills | Status: DC | PRN

## 2015-12-20 ENCOUNTER — Ambulatory Visit (INDEPENDENT_AMBULATORY_CARE_PROVIDER_SITE_OTHER): Payer: BLUE CROSS/BLUE SHIELD | Admitting: Internal Medicine

## 2015-12-20 ENCOUNTER — Encounter: Payer: Self-pay | Admitting: Internal Medicine

## 2015-12-20 VITALS — BP 122/82 | HR 84 | Resp 16 | Ht 67.0 in | Wt 177.2 lb

## 2015-12-20 DIAGNOSIS — R0981 Nasal congestion: Secondary | ICD-10-CM

## 2015-12-20 DIAGNOSIS — N6019 Diffuse cystic mastopathy of unspecified breast: Secondary | ICD-10-CM

## 2015-12-20 DIAGNOSIS — F411 Generalized anxiety disorder: Secondary | ICD-10-CM | POA: Diagnosis not present

## 2015-12-20 NOTE — Patient Instructions (Signed)
Flonase nasal spray or Sudafed 30 mg twice a day (either one for sinus congestion)

## 2015-12-20 NOTE — Progress Notes (Signed)
    Date:  12/20/2015   Name:  Denise Fernandez   DOB:  1960-11-15   MRN:  [id number]   Chief Complaint: Anxiety Anxiety  Presents for follow-up visit. Symptoms include irritability and nervous/anxious behavior. Patient reports no chest pain, palpitations, shortness of breath or suicidal ideas. Symptoms occur most days. The quality of sleep is good.     Review of Systems  Constitutional: Positive for irritability. Negative for appetite change, fatigue, fever and unexpected weight change.  HENT: Positive for congestion and sinus pressure. Negative for tinnitus and trouble swallowing.   Eyes: Negative for visual disturbance.  Respiratory: Negative for cough, chest tightness and shortness of breath.   Cardiovascular: Negative for chest pain, palpitations and leg swelling.  Gastrointestinal: Negative for abdominal pain.  Endocrine: Negative for polydipsia and polyuria.  Genitourinary: Negative for dysuria and hematuria.  Musculoskeletal: Negative for arthralgias.  Neurological: Negative for tremors, numbness and headaches.  Psychiatric/Behavioral: Negative for dysphoric mood, sleep disturbance and suicidal ideas. The patient is nervous/anxious.     Patient Active Problem List   Diagnosis Date Noted  . Major depressive disorder with single episode, in partial remission ([place]) 01/18/2015  . Generalized anxiety disorder 01/18/2015  . Asymptomatic bacteriuria 01/10/2015  . Fibrocystic breast 01/10/2015    Prior to Admission medications   Medication Sig Start Date End Date Taking? Authorizing Provider  ALPRAZolam Duanne Moron) 1 MG tablet Take 1 tablet (1 mg total) by mouth 2 (two) times daily as needed for anxiety. 12/18/15   Glean Hess, MD    No Known Allergies  Past Surgical History:  Procedure Laterality Date  . NEPHROLITHOTOMY  2012   Dr. Marcelline Mates  . PARTIAL HYSTERECTOMY  2001   Patient states that she has ovaries  . TONSILLECTOMY      Social History  Substance  Use Topics  . Smoking status: Former Smoker    Quit date: 02/18/2014  . Smokeless tobacco: Never Used  . Alcohol use 0.0 oz/week     Comment: occasional     Medication list has been reviewed and updated.   Physical Exam  Constitutional: She is oriented to person, place, and time. She appears well-developed. No distress.  HENT:  Head: Normocephalic and atraumatic.  Right Ear: Tympanic membrane is retracted.  Left Ear: Tympanic membrane is retracted.  Nose: Right sinus exhibits maxillary sinus tenderness.  Mouth/Throat: Oropharynx is clear and moist.  Neck: Normal range of motion. Neck supple. No thyromegaly present.  Cardiovascular: Normal rate, regular rhythm and normal heart sounds.   Pulmonary/Chest: Effort normal and breath sounds normal. No respiratory distress.  Musculoskeletal: Normal range of motion. She exhibits no edema or tenderness.  Neurological: She is alert and oriented to person, place, and time.  Skin: Skin is warm and dry. No rash noted.  Psychiatric: She has a normal mood and affect. Her behavior is normal. Thought content normal.  Nursing note and vitals reviewed.   BP 122/82   Pulse 84   Resp 16   Ht 5\' 7"  (1.702 m)   Wt 177 lb 3.2 oz (80.4 kg)   SpO2 98%   BMI 27.75 kg/m   Assessment and Plan: 1. Generalized anxiety disorder Continue prn xanax  2. Fibrocystic breast disease (FCBD), unspecified laterality Mammogram is scheduled for December  3. Sinus congestion Recommend Flonase NS or sudafed PRN  Halina Maidens, MD [facility]  12/20/2015

## 2016-01-29 ENCOUNTER — Ambulatory Visit
Admission: RE | Admit: 2016-01-29 | Discharge: 2016-01-29 | Disposition: A | Discharge status: Home / Self Care | Payer: BLUE CROSS/BLUE SHIELD | Source: Ambulatory Visit | Attending: Internal Medicine | Admitting: Internal Medicine

## 2016-01-29 ENCOUNTER — Other Ambulatory Visit: Payer: Self-pay | Admitting: Internal Medicine

## 2016-01-29 DIAGNOSIS — Z1231 Encounter for screening mammogram for malignant neoplasm of breast: Secondary | ICD-10-CM | POA: Insufficient documentation

## 2016-01-29 DIAGNOSIS — Z1239 Encounter for other screening for malignant neoplasm of breast: Secondary | ICD-10-CM

## 2016-02-07 ENCOUNTER — Other Ambulatory Visit: Payer: Self-pay | Admitting: Internal Medicine

## 2016-02-07 DIAGNOSIS — Z1231 Encounter for screening mammogram for malignant neoplasm of breast: Secondary | ICD-10-CM

## 2016-04-11 DIAGNOSIS — J449 Chronic obstructive pulmonary disease, unspecified: Secondary | ICD-10-CM | POA: Diagnosis not present

## 2016-04-11 DIAGNOSIS — R0602 Shortness of breath: Secondary | ICD-10-CM | POA: Diagnosis not present

## 2016-04-11 DIAGNOSIS — R05 Cough: Secondary | ICD-10-CM | POA: Diagnosis not present

## 2016-04-11 DIAGNOSIS — J310 Chronic rhinitis: Secondary | ICD-10-CM | POA: Diagnosis not present

## 2016-04-19 ENCOUNTER — Encounter: Payer: Self-pay | Admitting: Internal Medicine

## 2016-04-19 ENCOUNTER — Ambulatory Visit (INDEPENDENT_AMBULATORY_CARE_PROVIDER_SITE_OTHER): Payer: BLUE CROSS/BLUE SHIELD | Admitting: Internal Medicine

## 2016-04-19 VITALS — BP 112/78 | HR 68 | Ht 67.0 in | Wt 177.6 lb

## 2016-04-19 DIAGNOSIS — J449 Chronic obstructive pulmonary disease, unspecified: Secondary | ICD-10-CM | POA: Diagnosis not present

## 2016-04-19 DIAGNOSIS — Z0000 Encounter for general adult medical examination without abnormal findings: Secondary | ICD-10-CM | POA: Diagnosis not present

## 2016-04-19 DIAGNOSIS — F411 Generalized anxiety disorder: Secondary | ICD-10-CM

## 2016-04-19 DIAGNOSIS — N6019 Diffuse cystic mastopathy of unspecified breast: Secondary | ICD-10-CM | POA: Diagnosis not present

## 2016-04-19 DIAGNOSIS — R8271 Bacteriuria: Secondary | ICD-10-CM | POA: Diagnosis not present

## 2016-04-19 LAB — POCT URINALYSIS DIPSTICK
BILIRUBIN UA: NEGATIVE
Glucose, UA: NEGATIVE
KETONES UA: NEGATIVE
Nitrite, UA: POSITIVE
PH UA: 6.5
PROTEIN UA: NEGATIVE
SPEC GRAV UA: 1.01
Urobilinogen, UA: 0.2

## 2016-04-19 MED ORDER — ALPRAZOLAM 1 MG PO TABS: 1.0000 mg | ORAL_TABLET | Freq: Two times a day (BID) | ORAL | 5 refills | Status: DC | PRN

## 2016-04-19 NOTE — Progress Notes (Signed)
Date:  04/19/2016   Name:  Denise Fernandez   DOB:  April 22, 1960   MRN:  [id number]   Chief Complaint: Annual Exam Denise Fernandez is a 56 y.o. female who presents today for her Complete Annual Exam. She feels well. She reports exercising none. She reports she is sleeping well.  Anxiety  Presents for follow-up visit. Symptoms include shortness of breath. Patient reports no chest pain, dizziness, nervous/anxious behavior or palpitations. Symptoms occur most days.     COPD - seen by Dr. Raul Del and dx'd with stage II COPD.  Started on albuterol and waiting to get Spiriva.Has not smoked in 2 years.    Review of Systems  Constitutional: Negative for chills, fatigue and fever.  HENT: Negative for congestion, hearing loss, tinnitus, trouble swallowing and voice change.   Eyes: Negative for visual disturbance.  Respiratory: Positive for shortness of breath. Negative for cough, chest tightness and wheezing.   Cardiovascular: Negative for chest pain, palpitations and leg swelling.  Gastrointestinal: Negative for abdominal pain, constipation, diarrhea and vomiting.  Endocrine: Negative for polydipsia and polyuria.  Genitourinary: Negative for dysuria, frequency, genital sores, vaginal bleeding and vaginal discharge.  Musculoskeletal: Positive for arthralgias (right ankle). Negative for gait problem and joint swelling.  Skin: Negative for color change and rash.  Neurological: Negative for dizziness, tremors, light-headedness and headaches.  Hematological: Negative for adenopathy. Does not bruise/bleed easily.  Psychiatric/Behavioral: Negative for dysphoric mood and sleep disturbance. The patient is not nervous/anxious.     Patient Active Problem List   Diagnosis Date Noted  . Shortness of breath 04/11/2016  . Major depressive disorder with single episode, in partial remission ([place]) 01/18/2015  . Generalized anxiety disorder 01/18/2015  . Asymptomatic bacteriuria 01/10/2015  .  Fibrocystic breast disease (FCBD) 01/10/2015    Prior to Admission medications   Medication Sig Start Date End Date Taking? Authorizing Provider  ALPRAZolam Duanne Moron) 1 MG tablet Take 1 tablet (1 mg total) by mouth 2 (two) times daily as needed for anxiety. 12/18/15  Yes Glean Hess, MD  PROAIR HFA 108 [phone] Base) MCG/ACT inhaler INL 2 INHALATIONS ITL Q 6 H PRF [facility] 04/11/16  Yes Historical Provider, MD  SPIRIVA HANDIHALER 18 MCG inhalation capsule  04/18/16  Yes Historical Provider, MD    No Known Allergies  Past Surgical History:  Procedure Laterality Date  . NEPHROLITHOTOMY  2012   Dr. Marcelline Mates  . PARTIAL HYSTERECTOMY  2001   Patient states that she has ovaries  . TONSILLECTOMY      Social History  Substance Use Topics  . Smoking status: Former Smoker    Quit date: 02/18/2014  . Smokeless tobacco: Never Used  . Alcohol use 0.0 oz/week     Comment: occasional     Medication list has been reviewed and updated.   Physical Exam  Constitutional: She is oriented to person, place, and time. She appears well-developed and well-nourished. No distress.  HENT:  Head: Normocephalic and atraumatic.  Right Ear: Tympanic membrane and ear canal normal.  Left Ear: Tympanic membrane and ear canal normal.  Nose: Right sinus exhibits no maxillary sinus tenderness. Left sinus exhibits no maxillary sinus tenderness.  Mouth/Throat: Uvula is midline and oropharynx is clear and moist.  Eyes: Conjunctivae and EOM are normal. Right eye exhibits no discharge. Left eye exhibits no discharge. No scleral icterus.  Neck: Normal range of motion. Carotid bruit is not present. No erythema present. No thyromegaly present.  Cardiovascular: Normal rate, regular rhythm,  normal heart sounds and normal pulses.   Pulmonary/Chest: Effort normal. No respiratory distress. She has no wheezes. Right breast exhibits no mass, no nipple discharge, no skin change and no tenderness. Left breast exhibits no mass, no  nipple discharge, no skin change and no tenderness.  Abdominal: Soft. Bowel sounds are normal. There is no hepatosplenomegaly. There is no tenderness. There is no CVA tenderness.  Musculoskeletal: Normal range of motion. She exhibits no edema or tenderness.  Lymphadenopathy:    She has no cervical adenopathy.    She has no axillary adenopathy.  Neurological: She is alert and oriented to person, place, and time. She has normal reflexes. No cranial nerve deficit or sensory deficit.  Skin: Skin is warm, dry and intact. No rash noted.  Psychiatric: She has a normal mood and affect. Her speech is normal and behavior is normal. Thought content normal.  Nursing note and vitals reviewed.   BP 112/78   Pulse 68   Ht 5\' 7"  (1.702 m)   Wt 177 lb 9.6 oz (80.6 kg)   SpO2 100%   BMI 27.82 kg/m   Assessment and Plan: 1. Annual physical exam - Lipid panel - POCT urinalysis dipstick  2. Generalized anxiety disorder Doing well on low dose alprazolam - TSH  3. Fibrocystic breast disease (FCBD), unspecified laterality Recent mammogram done Schedule for this fall  4. Stage 2 moderate COPD by GOLD classification ([place]) Begin Spiriva as soon as possible - CBC with Differential/Platelet - Comprehensive metabolic panel  5. Bacteriuria No sx - education provided Call for antibiotics if needed   Meds ordered this encounter  Medications  . ALPRAZolam (XANAX) 1 MG tablet    Sig: Take 1 tablet (1 mg total) by mouth 2 (two) times daily as needed for anxiety.    Dispense:  30 tablet    Refill:  [place], MD [facility]  04/19/2016

## 2016-04-19 NOTE — Patient Instructions (Signed)
Asymptomatic Bacteriuria Asymptomatic bacteriuria is the presence of a large number of bacteria in the urine without the usual symptoms of burning or frequent urination. What are the causes? This condition is caused by an increase in bacteria in the urine. This increase can be caused by:  Bacteria entering the urinary tract, such as during sex.  A blockage in the urinary tract, such as from kidney stones or a tumor.  Bladder problems that prevent the bladder from emptying.  What increases the risk? You are more likely to develop this condition if:  You have diabetes mellitus.  You are an elderly adult, especially if you are also in a long-term care facility.  You are pregnant and in the first trimester.  You have kidney stones.  You are female.  You have had a kidney transplant.  You have a leaky kidney tube valve (reflux).  You had a urinary catheter for a long period of time.  What are the signs or symptoms? There are no symptoms of this condition. How is this diagnosed? This condition is diagnosed with a urine test. Because this condition does not cause symptoms, it is usually diagnosed when a urine sample is taken to treat or diagnose another condition, such as pregnancy or kidney problems. Most women who are in their first trimester of pregnancy are screened for asymptomatic bacteriuria. How is this treated? Usually, treatment is not needed for this condition. Treating the condition can lead to other problems, such as a yeast infection or the growth of bacteria that do not respond to treatment (antibiotic-resistant bacteria). Some people, such as pregnant women and people with kidney transplants, do need treatment with antibiotic medicines to prevent kidney infection (pyelonephritis). In pregnant women, kidney infection can lead to premature labor, fetal growth restriction, or newborn death. Follow these instructions at home: Medicines  Take over-the-counter and  prescription medicines only as told by your health care provider.  If you were prescribed an antibiotic medicine, take it as told by your health care provider. Do not stop taking the antibiotic even if you start to feel better. General instructions  Monitor your condition for any changes.  Drink enough fluid to keep your urine clear or pale yellow.  Go to the bathroom more often to keep your bladder empty.  If you are female, keep the area around your vagina and rectum clean. Wipe yourself from front to back after urinating.  Keep all follow-up visits as told by your health care provider. This is important. Contact a health care provider if:  You notice any new symptoms, such as back pain or burning while urinating. Get help right away if:  You develop signs of an infection such as: ? A burning sensation when you urinate. ? Have pain when you urinate. ? Develop an intense need to urinate. ? Urinating more frequently. ? Back pain or pelvic pain. ? Fever or chills.  You have blood in your urine.  Your urine becomes discolored or cloudy.  Your urine smells bad.  You have severe pain that cannot be controlled with medicine. Summary  Asymptomatic bacteriuria is the presence of a large number of bacteria in the urine without the usual symptoms of burning or frequent urination.  Usually, treatment is not needed for this condition. Treating the condition can lead to other problems, such as too much yeast and the growth of antibiotic-resistant bacteria.  Some people, such as pregnant women and people with kidney transplants, do need treatment with antibiotic medicines to prevent   kidney infection (pyelonephritis).  If you were prescribed an antibiotic medicine, take it as told by your health care provider. Do not stop taking the antibiotic even if you start to feel better. This information is not intended to replace advice given to you by your health care provider. Make sure you  discuss any questions you have with your health care provider. Document Released: 02/04/2005 Document Revised: 01/30/2016 Document Reviewed: 01/30/2016 Elsevier Interactive Patient Education  2017 Elsevier Inc.  

## 2016-04-20 LAB — COMPREHENSIVE METABOLIC PANEL
A/G RATIO: 1.9 (ref 1.2–2.2)
ALK PHOS: 84 IU/L (ref 39–117)
ALT: 16 IU/L (ref 0–32)
AST: 15 IU/L (ref 0–40)
Albumin: 4.6 g/dL (ref 3.5–5.5)
BILIRUBIN TOTAL: 0.3 mg/dL (ref 0.0–1.2)
BUN / CREAT RATIO: 13 (ref 9–23)
BUN: 9 mg/dL (ref 6–24)
CHLORIDE: 99 mmol/L (ref 96–106)
CO2: 26 mmol/L (ref 18–29)
Calcium: 9.6 mg/dL (ref 8.7–10.2)
Creatinine, Ser: 0.71 mg/dL (ref 0.57–1.00)
GFR calc non Af Amer: 97 mL/min/{1.73_m2} (ref >59)
GFR, EST AFRICAN AMERICAN: 112 mL/min/{1.73_m2} (ref >59)
GLUCOSE: 84 mg/dL (ref 65–99)
Globulin, Total: 2.4 g/dL (ref 1.5–4.5)
Potassium: 5 mmol/L (ref 3.5–5.2)
Sodium: 140 mmol/L (ref 134–144)
TOTAL PROTEIN: 7 g/dL (ref 6.0–8.5)

## 2016-04-20 LAB — CBC WITH DIFFERENTIAL/PLATELET
BASOS: 1 %
Basophils Absolute: 0 10*3/uL (ref 0.0–0.2)
EOS (ABSOLUTE): 0.1 10*3/uL (ref 0.0–0.4)
Eos: 2 %
Hematocrit: 42.7 % (ref 34.0–46.6)
Hemoglobin: 14.6 g/dL (ref 11.1–15.9)
IMMATURE GRANS (ABS): 0 10*3/uL (ref 0.0–0.1)
Immature Granulocytes: 0 %
LYMPHS ABS: 2.4 10*3/uL (ref 0.7–3.1)
LYMPHS: 39 %
MCH: 31.9 pg (ref 26.6–33.0)
MCHC: 34.2 g/dL (ref 31.5–35.7)
MCV: 93 fL (ref 79–97)
MONOS ABS: 0.4 10*3/uL (ref 0.1–0.9)
Monocytes: 6 %
Neutrophils Absolute: 3.1 10*3/uL (ref 1.4–7.0)
Neutrophils: 52 %
PLATELETS: 309 10*3/uL (ref 150–379)
RBC: 4.57 x10E6/uL (ref 3.77–5.28)
RDW: 13.1 % (ref 12.3–15.4)
WBC: 6 10*3/uL (ref 3.4–10.8)

## 2016-04-20 LAB — LIPID PANEL
CHOLESTEROL TOTAL: 174 mg/dL (ref 100–199)
Chol/HDL Ratio: 3 ratio units (ref 0.0–4.4)
HDL: 58 mg/dL (ref >39)
LDL Calculated: 92 mg/dL (ref 0–99)
Triglycerides: 122 mg/dL (ref 0–149)
VLDL Cholesterol Cal: 24 mg/dL (ref 5–40)

## 2016-04-20 LAB — TSH: TSH: 1.69 u[IU]/mL (ref 0.450–4.500)

## 2016-04-26 DIAGNOSIS — D225 Melanocytic nevi of trunk: Secondary | ICD-10-CM | POA: Diagnosis not present

## 2016-05-20 DIAGNOSIS — R0602 Shortness of breath: Secondary | ICD-10-CM | POA: Diagnosis not present

## 2016-05-20 DIAGNOSIS — J439 Emphysema, unspecified: Secondary | ICD-10-CM | POA: Diagnosis not present

## 2016-06-20 DIAGNOSIS — L448 Other specified papulosquamous disorders: Secondary | ICD-10-CM | POA: Diagnosis not present

## 2016-09-23 DIAGNOSIS — R0602 Shortness of breath: Secondary | ICD-10-CM | POA: Diagnosis not present

## 2016-09-23 DIAGNOSIS — J42 Unspecified chronic bronchitis: Secondary | ICD-10-CM | POA: Diagnosis not present

## 2016-10-30 ENCOUNTER — Other Ambulatory Visit: Payer: Self-pay | Admitting: Internal Medicine

## 2016-10-30 ENCOUNTER — Telehealth: Payer: Self-pay

## 2016-10-30 MED ORDER — ALPRAZOLAM 1 MG PO TABS: 1.0000 mg | ORAL_TABLET | Freq: Two times a day (BID) | ORAL | 5 refills | Status: DC | PRN

## 2016-10-30 NOTE — Telephone Encounter (Signed)
FAX received from pharmacy requesting refill on Xanax 1 mg tab- [facility] in [place]. Please Advise.

## 2016-10-30 NOTE — Telephone Encounter (Signed)
Printed and signed.  

## 2017-01-29 ENCOUNTER — Ambulatory Visit
Admission: RE | Admit: 2017-01-29 | Discharge: 2017-01-29 | Disposition: A | Discharge status: Home / Self Care | Payer: BLUE CROSS/BLUE SHIELD | Source: Ambulatory Visit | Attending: Internal Medicine | Admitting: Internal Medicine

## 2017-01-29 DIAGNOSIS — Z1231 Encounter for screening mammogram for malignant neoplasm of breast: Secondary | ICD-10-CM

## 2017-02-26 DIAGNOSIS — R0602 Shortness of breath: Secondary | ICD-10-CM | POA: Diagnosis not present

## 2017-02-26 DIAGNOSIS — Z122 Encounter for screening for malignant neoplasm of respiratory organs: Secondary | ICD-10-CM | POA: Diagnosis not present

## 2017-02-26 DIAGNOSIS — J449 Chronic obstructive pulmonary disease, unspecified: Secondary | ICD-10-CM | POA: Diagnosis not present

## 2017-03-05 ENCOUNTER — Other Ambulatory Visit: Payer: Self-pay | Admitting: Internal Medicine

## 2017-03-05 DIAGNOSIS — Z1231 Encounter for screening mammogram for malignant neoplasm of breast: Secondary | ICD-10-CM

## 2017-04-22 ENCOUNTER — Ambulatory Visit (INDEPENDENT_AMBULATORY_CARE_PROVIDER_SITE_OTHER): Payer: BLUE CROSS/BLUE SHIELD | Admitting: Internal Medicine

## 2017-04-22 ENCOUNTER — Telehealth: Payer: Self-pay | Admitting: *Deleted

## 2017-04-22 ENCOUNTER — Encounter: Payer: Self-pay | Admitting: Internal Medicine

## 2017-04-22 VITALS — BP 126/85 | HR 87 | Ht 67.0 in | Wt 174.0 lb

## 2017-04-22 DIAGNOSIS — Z0000 Encounter for general adult medical examination without abnormal findings: Secondary | ICD-10-CM

## 2017-04-22 DIAGNOSIS — J449 Chronic obstructive pulmonary disease, unspecified: Secondary | ICD-10-CM | POA: Diagnosis not present

## 2017-04-22 DIAGNOSIS — Z87891 Personal history of nicotine dependence: Secondary | ICD-10-CM

## 2017-04-22 DIAGNOSIS — F324 Major depressive disorder, single episode, in partial remission: Secondary | ICD-10-CM

## 2017-04-22 DIAGNOSIS — Z0001 Encounter for general adult medical examination with abnormal findings: Secondary | ICD-10-CM

## 2017-04-22 DIAGNOSIS — Z23 Encounter for immunization: Secondary | ICD-10-CM | POA: Diagnosis not present

## 2017-04-22 DIAGNOSIS — Z122 Encounter for screening for malignant neoplasm of respiratory organs: Secondary | ICD-10-CM

## 2017-04-22 DIAGNOSIS — Z1239 Encounter for other screening for malignant neoplasm of breast: Secondary | ICD-10-CM

## 2017-04-22 LAB — POCT URINALYSIS DIPSTICK
Bilirubin, UA: NEGATIVE
Blood, UA: NEGATIVE
Glucose, UA: NEGATIVE
Ketones, UA: NEGATIVE
Leukocytes, UA: NEGATIVE
NITRITE UA: NEGATIVE
PH UA: 6.5 (ref 5.0–8.0)
PROTEIN UA: NEGATIVE
Spec Grav, UA: <=1.005 — AB (ref 1.010–1.025)
UROBILINOGEN UA: 0.2 U/dL

## 2017-04-22 NOTE — Progress Notes (Signed)
Date:  04/22/2017   Name:  Denise Fernandez   DOB:  1960/04/30   MRN:  [id number]   Chief Complaint: Annual Exam (Breast Exam. ) Denise Fernandez is a 57 y.o. female who presents today for her Complete Annual Exam. She feels well. She reports exercising none. She reports she is sleeping well. She has no breast issues. Mammogram is already scheduled.  Colonoscopy is up to date.  COPD - doing well.  Using Spiriva and albuterol. She has had her flu vaccine but no pneumonia vaccine.  Dr. Raul Del had wanted her to get low dose screening CT.  Anxiety - doing well, taking xanax about 5 times per week.  Has been a bit more stressed recently with her elderly father - she has moved in with him due to more health issues.  Review of Systems  Constitutional: Negative for chills, fatigue and fever.  HENT: Negative for congestion, hearing loss, tinnitus, trouble swallowing and voice change.   Eyes: Negative for visual disturbance.  Respiratory: Negative for cough, chest tightness, shortness of breath and wheezing.   Cardiovascular: Negative for chest pain, palpitations and leg swelling.  Gastrointestinal: Negative for abdominal pain, constipation, diarrhea and vomiting.  Endocrine: Negative for polydipsia and polyuria.  Genitourinary: Negative for dysuria, frequency, genital sores, vaginal bleeding and vaginal discharge.  Musculoskeletal: Negative for arthralgias, gait problem and joint swelling.  Skin: Negative for color change and rash.  Neurological: Negative for dizziness, tremors, light-headedness and headaches.  Hematological: Negative for adenopathy. Does not bruise/bleed easily.  Psychiatric/Behavioral: Negative for dysphoric mood and sleep disturbance. The patient is not nervous/anxious.     Patient Active Problem List   Diagnosis Date Noted  . Stage 2 moderate COPD by GOLD classification ([place]) 04/22/2017  . Shortness of breath 04/11/2016  . Major depressive disorder with single  episode, in partial remission ([place]) 01/18/2015  . Generalized anxiety disorder 01/18/2015  . Asymptomatic bacteriuria 01/10/2015  . Fibrocystic breast disease (FCBD) 01/10/2015    Prior to Admission medications   Medication Sig Start Date End Date Taking? Authorizing Provider  ALPRAZolam Duanne Moron) 1 MG tablet Take 1 tablet (1 mg total) by mouth 2 (two) times daily as needed for anxiety. 10/30/16   Glean Hess, MD  PROAIR HFA 108 [phone] Base) MCG/ACT inhaler INL 2 INHALATIONS ITL Q 6 H PRF [facility] 04/11/16   [provider]  SPIRIVA HANDIHALER 18 MCG inhalation capsule  04/18/16   [provider]    No Known Allergies  Past Surgical History:  Procedure Laterality Date  . NEPHROLITHOTOMY  2012   Dr. Marcelline Mates  . PARTIAL HYSTERECTOMY  2001   Patient states that she has ovaries  . TONSILLECTOMY      Social History   Tobacco Use  . Smoking status: Former Smoker    Packs/day: 2.00    Years: 30.00    Pack years: 60.00    Types: Cigarettes    Last attempt to quit: 02/18/2014    Years since quitting: 3.1  . Smokeless tobacco: Never Used  Substance Use Topics  . Alcohol use: Yes    Alcohol/week: 0.0 oz    Comment: occasional  . Drug use: No     Medication list has been reviewed and updated.  PHQ 2/9 Scores 04/22/2017 12/20/2015  PHQ - 2 Score 1 4  PHQ- 9 Score 1 7    Physical Exam  Constitutional: She is oriented to person, place, and time. She appears well-developed and well-nourished. No  distress.  HENT:  Head: Normocephalic and atraumatic.  Right Ear: Tympanic membrane and ear canal normal.  Left Ear: Tympanic membrane and ear canal normal.  Nose: Right sinus exhibits no maxillary sinus tenderness. Left sinus exhibits no maxillary sinus tenderness.  Mouth/Throat: Uvula is midline and oropharynx is clear and moist.  Eyes: Conjunctivae and EOM are normal. Right eye exhibits no discharge. Left eye exhibits no discharge. No scleral icterus.  Neck: Normal  range of motion. Carotid bruit is not present. No erythema present. No thyromegaly present.  Cardiovascular: Normal rate, regular rhythm, normal heart sounds and normal pulses.  Pulmonary/Chest: Effort normal. No respiratory distress. She has no wheezes. Right breast exhibits no mass, no nipple discharge, no skin change and no tenderness. Left breast exhibits no mass, no nipple discharge, no skin change and no tenderness.  Abdominal: Soft. Bowel sounds are normal. There is no hepatosplenomegaly. There is no tenderness. There is no CVA tenderness.  Musculoskeletal: Normal range of motion.  Lymphadenopathy:    She has no cervical adenopathy.    She has no axillary adenopathy.  Neurological: She is alert and oriented to person, place, and time. She has normal reflexes. No cranial nerve deficit or sensory deficit.  Skin: Skin is warm, dry and intact. No rash noted.  Psychiatric: She has a normal mood and affect. Her speech is normal and behavior is normal. Thought content normal.  Nursing note and vitals reviewed.   BP 126/85   Pulse 87   Ht 5\' 7"  (1.702 m)   Wt 174 lb (78.9 kg)   SpO2 99%   BMI 27.25 kg/m   Assessment and Plan: 1. Annual physical exam Normal exam  Need to begin regular exercise - CBC with Differential/Platelet - Comprehensive metabolic panel - Lipid panel - POCT urinalysis dipstick  2. Breast cancer screening scheduled  3. Stage 2 moderate COPD by GOLD classification ([place]) Doing well on Spiriva  4. Major depressive disorder with single episode, in partial remission ([place]) Resolved; still uses xanax PRN for anxiety - TSH  5. Need for pneumococcal vaccination - Pneumococcal polysaccharide vaccine 23-valent greater than or equal to 2yo subcutaneous/IM   No orders of the defined types were placed in this encounter.   Partially dictated using Editor, commissioning. Any errors are unintentional.  Denise Maidens, MD [facility]  04/22/2017

## 2017-04-22 NOTE — Telephone Encounter (Signed)
Received referral for initial lung cancer screening scan. Contacted patient and obtained smoking history,(former, quit 2016, 76 pack year) as well as answering questions related to screening process. Patient denies signs of lung cancer such as weight loss or hemoptysis. Patient denies comorbidity that would prevent curative treatment if lung cancer were found. Patient is scheduled for shared decision making visit and CT scan on 05/08/17.

## 2017-04-22 NOTE — Patient Instructions (Addendum)
Call insurance to see if low dose screening CT is covered.Pneumococcal Polysaccharide Vaccine: What You Need to Know 1. Why get vaccinated? Vaccination can protect older adults (and some children and younger adults) from pneumococcal disease. Pneumococcal disease is caused by bacteria that can spread from person to person through close contact. It can cause ear infections, and it can also lead to more serious infections of the:  Lungs (pneumonia),  Blood (bacteremia), and  Covering of the brain and spinal cord (meningitis). Meningitis can cause deafness and brain damage, and it can be fatal.  Anyone can get pneumococcal disease, but children under 55 years of age, people with certain medical conditions, adults over 67 years of age, and cigarette smokers are at the highest risk. About 18,000 older adults die each year from pneumococcal disease in the [place]. Treatment of pneumococcal infections with penicillin and other drugs used to be more effective. But some strains of the disease have become resistant to these drugs. This makes prevention of the disease, through vaccination, even more important. 2. Pneumococcal polysaccharide vaccine (PPSV23) Pneumococcal polysaccharide vaccine (PPSV23) protects against 23 types of pneumococcal bacteria. It will not prevent all pneumococcal disease. PPSV23 is recommended for:  All adults 58 years of age and older,  Anyone 2 through 56 years of age with certain long-term health problems,  Anyone 2 through 56 years of age with a weakened immune system,  Adults 62 through 56 years of age who smoke cigarettes or have asthma.  Most people need only one dose of PPSV. A second dose is recommended for certain high-risk groups. People 74 and older should get a dose even if they have gotten one or more doses of the vaccine before they turned 65. Your healthcare provider can give you more information about these recommendations. Most healthy adults develop  protection within 2 to 3 weeks of getting the shot. 3. Some people should not get this vaccine  Anyone who has had a life-threatening allergic reaction to PPSV should not get another dose.  Anyone who has a severe allergy to any component of PPSV should not receive it. Tell your provider if you have any severe allergies.  Anyone who is moderately or severely ill when the shot is scheduled may be asked to wait until they recover before getting the vaccine. Someone with a mild illness can usually be vaccinated.  Children less than 54 years of age should not receive this vaccine.  There is no evidence that PPSV is harmful to either a pregnant woman or to her fetus. However, as a precaution, women who need the vaccine should be vaccinated before becoming pregnant, if possible. 4. Risks of a vaccine reaction With any medicine, including vaccines, there is a chance of side effects. These are usually mild and go away on their own, but serious reactions are also possible. About half of people who get PPSV have mild side effects, such as redness or pain where the shot is given, which go away within about two days. Less than 1 out of 100 people develop a fever, muscle aches, or more severe local reactions. Problems that could happen after any vaccine:  People sometimes faint after a medical procedure, including vaccination. Sitting or lying down for about 15 minutes can help prevent fainting, and injuries caused by a fall. Tell your doctor if you feel dizzy, or have vision changes or ringing in the ears.  Some people get severe pain in the shoulder and have difficulty moving the arm where  a shot was given. This happens very rarely.  Any medication can cause a severe allergic reaction. Such reactions from a vaccine are very rare, estimated at about 1 in a million doses, and would happen within a few minutes to a few hours after the vaccination. As with any medicine, there is a very remote chance of a  vaccine causing a serious injury or death. The safety of vaccines is always being monitored. For more information, visit: http://www.aguilar.org/ 5. What if there is a serious reaction? What should I look for? Look for anything that concerns you, such as signs of a severe allergic reaction, very high fever, or unusual behavior. Signs of a severe allergic reaction can include hives, swelling of the face and throat, difficulty breathing, a fast heartbeat, dizziness, and weakness. These would usually start a few minutes to a few hours after the vaccination. What should I do? If you think it is a severe allergic reaction or other emergency that can't wait, call 9-1-1 or get to the nearest hospital. Otherwise, call your doctor. Afterward, the reaction should be reported to the Vaccine Adverse Event Reporting System (VAERS). Your doctor might file this report, or you can do it yourself through the VAERS web site at www.vaers.SamedayNews.es, or by calling 726-149-3027. VAERS does not give medical advice. 6. How can I learn more?  Ask your doctor. He or she can give you the vaccine package insert or suggest other sources of information.  Call your local or state health department.  Contact the Centers for Disease Control and Prevention (CDC): ? Call 3473507327 (1-800-CDC-INFO) or ? Visit CDC's website at http://hunter.com/ CDC Pneumococcal Polysaccharide Vaccine VIS (06/11/13) This information is not intended to replace advice given to you by your health care provider. Make sure you discuss any questions you have with your health care provider. Document Released: 12/02/2005 Document Revised: 10/26/2015 Document Reviewed: 10/26/2015 Elsevier Interactive Patient Education  2017 [employer].

## 2017-04-23 LAB — CBC WITH DIFFERENTIAL/PLATELET
BASOS: 1 %
Basophils Absolute: 0 10*3/uL (ref 0.0–0.2)
EOS (ABSOLUTE): 0.1 10*3/uL (ref 0.0–0.4)
EOS: 2 %
HEMATOCRIT: 42.6 % (ref 34.0–46.6)
HEMOGLOBIN: 15 g/dL (ref 11.1–15.9)
IMMATURE GRANS (ABS): 0 10*3/uL (ref 0.0–0.1)
Immature Granulocytes: 0 %
Lymphocytes Absolute: 2.4 10*3/uL (ref 0.7–3.1)
Lymphs: 46 %
MCH: 31.8 pg (ref 26.6–33.0)
MCHC: 35.2 g/dL (ref 31.5–35.7)
MCV: 90 fL (ref 79–97)
MONOCYTES: 6 %
Monocytes Absolute: 0.3 10*3/uL (ref 0.1–0.9)
NEUTROS ABS: 2.3 10*3/uL (ref 1.4–7.0)
Neutrophils: 45 %
Platelets: 300 10*3/uL (ref 150–379)
RBC: 4.71 x10E6/uL (ref 3.77–5.28)
RDW: 13.4 % (ref 12.3–15.4)
WBC: 5.1 10*3/uL (ref 3.4–10.8)

## 2017-04-23 LAB — COMPREHENSIVE METABOLIC PANEL
ALBUMIN: 4.6 g/dL (ref 3.5–5.5)
ALK PHOS: 106 IU/L (ref 39–117)
ALT: 56 IU/L — ABNORMAL HIGH (ref 0–32)
AST: 31 IU/L (ref 0–40)
Albumin/Globulin Ratio: 1.9 (ref 1.2–2.2)
BILIRUBIN TOTAL: 0.3 mg/dL (ref 0.0–1.2)
BUN / CREAT RATIO: 17 (ref 9–23)
BUN: 13 mg/dL (ref 6–24)
CO2: 25 mmol/L (ref 20–29)
Calcium: 9.6 mg/dL (ref 8.7–10.2)
Chloride: 101 mmol/L (ref 96–106)
Creatinine, Ser: 0.75 mg/dL (ref 0.57–1.00)
GFR calc non Af Amer: 90 mL/min/{1.73_m2} (ref >59)
GFR, EST AFRICAN AMERICAN: 104 mL/min/{1.73_m2} (ref >59)
GLOBULIN, TOTAL: 2.4 g/dL (ref 1.5–4.5)
GLUCOSE: 76 mg/dL (ref 65–99)
Potassium: 4.7 mmol/L (ref 3.5–5.2)
SODIUM: 140 mmol/L (ref 134–144)
TOTAL PROTEIN: 7 g/dL (ref 6.0–8.5)

## 2017-04-23 LAB — LIPID PANEL
CHOLESTEROL TOTAL: 192 mg/dL (ref 100–199)
Chol/HDL Ratio: 2.7 ratio (ref 0.0–4.4)
HDL: 71 mg/dL (ref >39)
LDL Calculated: 99 mg/dL (ref 0–99)
Triglycerides: 111 mg/dL (ref 0–149)
VLDL CHOLESTEROL CAL: 22 mg/dL (ref 5–40)

## 2017-04-23 LAB — TSH: TSH: 1.88 u[IU]/mL (ref 0.450–4.500)

## 2017-05-01 DIAGNOSIS — L820 Inflamed seborrheic keratosis: Secondary | ICD-10-CM | POA: Diagnosis not present

## 2017-05-08 ENCOUNTER — Inpatient Hospital Stay: Payer: BLUE CROSS/BLUE SHIELD | Attending: Nurse Practitioner | Admitting: Nurse Practitioner

## 2017-05-08 ENCOUNTER — Ambulatory Visit
Admission: RE | Admit: 2017-05-08 | Discharge: 2017-05-08 | Disposition: A | Discharge status: Home / Self Care | Payer: BLUE CROSS/BLUE SHIELD | Source: Ambulatory Visit | Attending: Nurse Practitioner | Admitting: Nurse Practitioner

## 2017-05-08 DIAGNOSIS — Z87891 Personal history of nicotine dependence: Secondary | ICD-10-CM | POA: Diagnosis not present

## 2017-05-08 DIAGNOSIS — Z122 Encounter for screening for malignant neoplasm of respiratory organs: Secondary | ICD-10-CM

## 2017-05-08 DIAGNOSIS — I700 Atherosclerosis of aorta: Secondary | ICD-10-CM | POA: Diagnosis not present

## 2017-05-08 DIAGNOSIS — J439 Emphysema, unspecified: Secondary | ICD-10-CM | POA: Insufficient documentation

## 2017-05-08 NOTE — Progress Notes (Signed)
In accordance with CMS guidelines, patient has met eligibility criteria including age, absence of signs or symptoms of lung cancer.  Social History   Tobacco Use  . Smoking status: Former Smoker    Packs/day: 2.00    Years: 30.00    Pack years: 60.00    Types: Cigarettes    Last attempt to quit: 02/18/2014    Years since quitting: 3.2  . Smokeless tobacco: Never Used  Substance Use Topics  . Alcohol use: Yes    Alcohol/week: 0.0 oz    Comment: occasional  . Drug use: No     A shared decision-making session was conducted prior to the performance of CT scan. This includes one or more decision aids, includes benefits and harms of screening, follow-up diagnostic testing, over-diagnosis, false positive rate, and total radiation exposure.  Counseling on the importance of adherence to annual lung cancer LDCT screening, impact of co-morbidities, and ability or willingness to undergo diagnosis and treatment is imperative for compliance of the program.  Counseling on the importance of continued smoking cessation for former smokers; the importance of smoking cessation for current smokers, and information about tobacco cessation interventions have been given to patient including [place] and [facility] quit [place] programs.  Written order for lung cancer screening with LDCT has been given to the patient and any and all questions have been answered to the best of my abilities.   Yearly follow up will be coordinated by Burgess Estelle, Thoracic Navigator.  Beckey Rutter, DNP, AGNP-C [place] [facility] [phone] (office) 05/08/17 5:39 PM

## 2017-05-12 ENCOUNTER — Telehealth: Payer: Self-pay | Admitting: *Deleted

## 2017-05-12 NOTE — Telephone Encounter (Signed)
Notified patient of LDCT lung cancer screening program results with recommendation for 12 month follow up imaging. Also notified of incidental findings noted below and is encouraged to discuss further with PCP who will receive a copy of this note and/or the CT report. Patient verbalizes understanding.   IMPRESSION: 1. Lung-RADS 2, benign appearance or behavior. Continue annual screening with low-dose chest CT without contrast in 12 months. 2. Aortic atherosclerosis. 3. Mild diffuse bronchial wall thickening with mild centrilobular and paraseptal emphysema; imaging findings suggestive of underlying COPD.  Aortic Atherosclerosis (ICD10-I70.0) and Emphysema (ICD10-J43.9).

## 2017-06-05 ENCOUNTER — Other Ambulatory Visit: Payer: Self-pay | Admitting: Internal Medicine

## 2017-07-25 ENCOUNTER — Other Ambulatory Visit: Payer: Self-pay | Admitting: Internal Medicine

## 2017-08-26 DIAGNOSIS — R0602 Shortness of breath: Secondary | ICD-10-CM | POA: Diagnosis not present

## 2017-08-26 DIAGNOSIS — J449 Chronic obstructive pulmonary disease, unspecified: Secondary | ICD-10-CM | POA: Diagnosis not present

## 2017-10-22 ENCOUNTER — Other Ambulatory Visit: Payer: Self-pay | Admitting: Internal Medicine

## 2017-11-13 DIAGNOSIS — M9901 Segmental and somatic dysfunction of cervical region: Secondary | ICD-10-CM | POA: Diagnosis not present

## 2017-11-13 DIAGNOSIS — M7918 Myalgia, other site: Secondary | ICD-10-CM | POA: Diagnosis not present

## 2017-11-13 DIAGNOSIS — M50323 Other cervical disc degeneration at C6-C7 level: Secondary | ICD-10-CM | POA: Diagnosis not present

## 2017-11-14 DIAGNOSIS — M9901 Segmental and somatic dysfunction of cervical region: Secondary | ICD-10-CM | POA: Diagnosis not present

## 2017-11-14 DIAGNOSIS — M7918 Myalgia, other site: Secondary | ICD-10-CM | POA: Diagnosis not present

## 2017-11-14 DIAGNOSIS — M50323 Other cervical disc degeneration at C6-C7 level: Secondary | ICD-10-CM | POA: Diagnosis not present

## 2017-11-18 DIAGNOSIS — M7918 Myalgia, other site: Secondary | ICD-10-CM | POA: Diagnosis not present

## 2017-11-18 DIAGNOSIS — M9901 Segmental and somatic dysfunction of cervical region: Secondary | ICD-10-CM | POA: Diagnosis not present

## 2017-11-18 DIAGNOSIS — M50323 Other cervical disc degeneration at C6-C7 level: Secondary | ICD-10-CM | POA: Diagnosis not present

## 2017-11-24 DIAGNOSIS — M9901 Segmental and somatic dysfunction of cervical region: Secondary | ICD-10-CM | POA: Diagnosis not present

## 2017-11-24 DIAGNOSIS — M50323 Other cervical disc degeneration at C6-C7 level: Secondary | ICD-10-CM | POA: Diagnosis not present

## 2017-11-24 DIAGNOSIS — M7918 Myalgia, other site: Secondary | ICD-10-CM | POA: Diagnosis not present

## 2017-12-02 DIAGNOSIS — M9901 Segmental and somatic dysfunction of cervical region: Secondary | ICD-10-CM | POA: Diagnosis not present

## 2017-12-02 DIAGNOSIS — M50323 Other cervical disc degeneration at C6-C7 level: Secondary | ICD-10-CM | POA: Diagnosis not present

## 2017-12-02 DIAGNOSIS — M7918 Myalgia, other site: Secondary | ICD-10-CM | POA: Diagnosis not present

## 2017-12-09 DIAGNOSIS — M7918 Myalgia, other site: Secondary | ICD-10-CM | POA: Diagnosis not present

## 2017-12-09 DIAGNOSIS — M50323 Other cervical disc degeneration at C6-C7 level: Secondary | ICD-10-CM | POA: Diagnosis not present

## 2017-12-09 DIAGNOSIS — M9901 Segmental and somatic dysfunction of cervical region: Secondary | ICD-10-CM | POA: Diagnosis not present

## 2018-01-19 ENCOUNTER — Other Ambulatory Visit: Payer: Self-pay | Admitting: Internal Medicine

## 2018-01-30 ENCOUNTER — Ambulatory Visit
Admission: RE | Admit: 2018-01-30 | Discharge: 2018-01-30 | Disposition: A | Discharge status: Home / Self Care | Payer: BLUE CROSS/BLUE SHIELD | Source: Ambulatory Visit | Attending: Internal Medicine | Admitting: Internal Medicine

## 2018-01-30 DIAGNOSIS — Z1231 Encounter for screening mammogram for malignant neoplasm of breast: Secondary | ICD-10-CM

## 2018-03-02 ENCOUNTER — Ambulatory Visit: Payer: BLUE CROSS/BLUE SHIELD | Admitting: Internal Medicine

## 2018-03-02 ENCOUNTER — Encounter: Payer: Self-pay | Admitting: Internal Medicine

## 2018-03-02 VITALS — BP 132/86 | HR 86 | Temp 98.2°F | Ht 66.0 in | Wt 177.8 lb

## 2018-03-02 DIAGNOSIS — B354 Tinea corporis: Secondary | ICD-10-CM | POA: Diagnosis not present

## 2018-03-02 DIAGNOSIS — J0100 Acute maxillary sinusitis, unspecified: Secondary | ICD-10-CM

## 2018-03-02 MED ORDER — DOXYCYCLINE HYCLATE 100 MG PO TABS: 100.0000 mg | ORAL_TABLET | Freq: Two times a day (BID) | ORAL | 0 refills | Status: AC

## 2018-03-02 MED ORDER — NYSTATIN 100000 UNIT/GM EX CREA: 1.0000 "application " | TOPICAL_CREAM | Freq: Two times a day (BID) | CUTANEOUS | 1 refills | Status: DC

## 2018-03-02 NOTE — Progress Notes (Signed)
Date:  03/02/2018   Name:  Denise Fernandez   DOB:  July 20, 1960   MRN:  [id number]   Chief Complaint: Cough (Started Christmas day. Chest congestion and some cough. Feels like "stuck in chest like a cat with a hairball." ) and Rash (Had this previously, but rash in between thigh's is back. )  Cough  This is a new problem. The current episode started 1 to 4 weeks ago. The problem has been unchanged. The problem occurs every few minutes. The cough is productive of sputum. Associated symptoms include a rash, a sore throat, shortness of breath and wheezing. Pertinent negatives include no chest pain, chills, fever or headaches. She has tried a beta-agonist inhaler and steroid inhaler for the symptoms. The treatment provided moderate relief. Her past medical history is significant for COPD.  Rash  This is a recurrent problem. The problem has been gradually worsening since onset. The affected locations include the groin. The rash is characterized by redness and itchiness. She was exposed to nothing. Associated symptoms include congestion, coughing, shortness of breath and a sore throat. Pertinent negatives include no fatigue or fever.    Review of Systems  Constitutional: Negative for chills, fatigue and fever.  HENT: Positive for congestion, sinus pressure, sore throat and voice change. Negative for trouble swallowing.   Respiratory: Positive for cough, shortness of breath and wheezing.   Cardiovascular: Negative for chest pain and palpitations.  Skin: Positive for rash.  Neurological: Negative for facial asymmetry and headaches.    Patient Active Problem List   Diagnosis Date Noted  . Stage 2 moderate COPD by GOLD classification ([place]) 04/22/2017  . Shortness of breath 04/11/2016  . Generalized anxiety disorder 01/18/2015  . Asymptomatic bacteriuria 01/10/2015  . Fibrocystic breast disease (FCBD) 01/10/2015    No Known Allergies  Past Surgical History:  Procedure Laterality Date  .  ABDOMINAL HYSTERECTOMY    . NEPHROLITHOTOMY  2012   Dr. Marcelline Mates  . PARTIAL HYSTERECTOMY  2001   Patient states that she has ovaries  . TONSILLECTOMY      Social History   Tobacco Use  . Smoking status: Former Smoker    Packs/day: 2.00    Years: 30.00    Pack years: 60.00    Types: Cigarettes    Last attempt to quit: 02/18/2014    Years since quitting: 4.0  . Smokeless tobacco: Never Used  Substance Use Topics  . Alcohol use: Yes    Alcohol/week: 0.0 standard drinks    Comment: occasional  . Drug use: No     Medication list has been reviewed and updated.  Current Meds  Medication Sig  . ALPRAZolam (XANAX) 1 MG tablet TAKE 1 TABLET BY MOUTH TWICE DAILY AS NEEDED FOR ANXIETY  . PROAIR HFA 108 (90 Base) MCG/ACT inhaler INL 2 INHALATIONS ITL Q 6 H PRF WHZ  . SPIRIVA HANDIHALER 18 MCG inhalation capsule     PHQ 2/9 Scores 03/02/2018 04/22/2017 12/20/2015  PHQ - 2 Score 0 1 4  PHQ- 9 Score - 1 7    Physical Exam Constitutional:      Appearance: She is well-developed.  HENT:     Right Ear: Ear canal and external ear normal. Tympanic membrane is not erythematous or retracted.     Left Ear: Ear canal and external ear normal. Tympanic membrane is not erythematous or retracted.     Nose:     Right Sinus: Maxillary sinus tenderness present. No frontal sinus tenderness.  Left Sinus: Maxillary sinus tenderness present. No frontal sinus tenderness.     Mouth/Throat:     Mouth: No oral lesions.     Pharynx: Uvula midline. No oropharyngeal exudate, posterior oropharyngeal erythema or uvula swelling.  Cardiovascular:     Rate and Rhythm: Normal rate and regular rhythm.     Heart sounds: Normal heart sounds.  Pulmonary:     Breath sounds: Decreased breath sounds present. No wheezing, rhonchi or rales.  Lymphadenopathy:     Cervical: No cervical adenopathy.  Skin:    Comments: Red rash in groin folds  Neurological:     Mental Status: She is alert and oriented to  person, place, and time.     BP 132/86 (BP Location: Right Arm, Patient Position: Sitting, Cuff Size: Normal)   Pulse 86   Temp 98.2 F (36.8 C) (Oral)   Ht 5\' 6"  (1.676 m)   Wt 177 lb 12.8 oz (80.6 kg)   SpO2 99%   BMI 28.70 kg/m   Assessment and Plan: 1. Acute non-recurrent maxillary sinusitis Continue nyquil, start Flonase - doxycycline (VIBRA-TABS) 100 MG tablet; Take 1 tablet (100 mg total) by mouth 2 (two) times daily for 10 days.  Dispense: 20 tablet; Refill: 0  2. Tinea corporis - nystatin cream (MYCOSTATIN); Apply 1 application topically 2 (two) times daily.  Dispense: 30 g; Refill: 1   Partially dictated using Editor, commissioning. Any errors are unintentional.  Halina Maidens, MD [facility]  03/02/2018

## 2018-03-02 NOTE — Patient Instructions (Signed)
Flonase nasal spray - 2 sprays once a day in each nostril

## 2018-03-10 DIAGNOSIS — G479 Sleep disorder, unspecified: Secondary | ICD-10-CM | POA: Diagnosis not present

## 2018-03-10 DIAGNOSIS — J449 Chronic obstructive pulmonary disease, unspecified: Secondary | ICD-10-CM | POA: Diagnosis not present

## 2018-03-10 DIAGNOSIS — R0602 Shortness of breath: Secondary | ICD-10-CM | POA: Diagnosis not present

## 2018-03-25 DIAGNOSIS — M25571 Pain in right ankle and joints of right foot: Secondary | ICD-10-CM | POA: Diagnosis not present

## 2018-03-25 DIAGNOSIS — M2580 Other specified joint disorders, unspecified joint: Secondary | ICD-10-CM | POA: Diagnosis not present

## 2018-04-19 ENCOUNTER — Encounter: Payer: Self-pay | Admitting: Internal Medicine

## 2018-04-22 DIAGNOSIS — M25571 Pain in right ankle and joints of right foot: Secondary | ICD-10-CM | POA: Diagnosis not present

## 2018-04-22 DIAGNOSIS — M25871 Other specified joint disorders, right ankle and foot: Secondary | ICD-10-CM | POA: Diagnosis not present

## 2018-04-23 ENCOUNTER — Encounter: Payer: Self-pay | Admitting: Internal Medicine

## 2018-04-23 ENCOUNTER — Other Ambulatory Visit: Payer: Self-pay

## 2018-04-23 ENCOUNTER — Encounter: Payer: Self-pay | Admitting: *Deleted

## 2018-04-23 ENCOUNTER — Ambulatory Visit (INDEPENDENT_AMBULATORY_CARE_PROVIDER_SITE_OTHER): Payer: BLUE CROSS/BLUE SHIELD | Admitting: Internal Medicine

## 2018-04-23 VITALS — BP 132/76 | HR 80 | Ht 66.0 in | Wt 175.0 lb

## 2018-04-23 DIAGNOSIS — I700 Atherosclerosis of aorta: Secondary | ICD-10-CM | POA: Diagnosis not present

## 2018-04-23 DIAGNOSIS — J449 Chronic obstructive pulmonary disease, unspecified: Secondary | ICD-10-CM

## 2018-04-23 DIAGNOSIS — F17201 Nicotine dependence, unspecified, in remission: Secondary | ICD-10-CM

## 2018-04-23 DIAGNOSIS — F411 Generalized anxiety disorder: Secondary | ICD-10-CM

## 2018-04-23 DIAGNOSIS — Z1231 Encounter for screening mammogram for malignant neoplasm of breast: Secondary | ICD-10-CM | POA: Diagnosis not present

## 2018-04-23 DIAGNOSIS — Z0000 Encounter for general adult medical examination without abnormal findings: Secondary | ICD-10-CM | POA: Diagnosis not present

## 2018-04-23 LAB — POCT URINALYSIS DIPSTICK
Bilirubin, UA: NEGATIVE
GLUCOSE UA: NEGATIVE
Ketones, UA: NEGATIVE
LEUKOCYTES UA: NEGATIVE
NITRITE UA: NEGATIVE
Protein, UA: NEGATIVE
SPEC GRAV UA: 1.015 (ref 1.010–1.025)
Urobilinogen, UA: 0.2 E.U./dL
pH, UA: 5 (ref 5.0–8.0)

## 2018-04-23 MED ORDER — ATORVASTATIN CALCIUM 10 MG PO TABS: 10.0000 mg | ORAL_TABLET | Freq: Every day | ORAL | 1 refills | Status: DC

## 2018-04-23 MED ORDER — ALPRAZOLAM 1 MG PO TABS: 1.0000 mg | ORAL_TABLET | Freq: Two times a day (BID) | ORAL | 0 refills | Status: DC | PRN

## 2018-04-23 NOTE — Patient Instructions (Signed)
Return for labs only in 6 months (cholesterol)

## 2018-04-23 NOTE — Progress Notes (Signed)
Date:  04/23/2018   Name:  Denise Fernandez   DOB:  09/02/1960   MRN:  [id number]   Chief Complaint: Annual Exam (No pap. ) Denise Fernandez is a 58 y.o. female who presents today for her Complete Annual Exam. She feels fairly well. She reports exercising walking. She reports she is sleeping fairly well.  Mammogram was done in December. Colonoscopy was done in 2015. She remains tobacco free.  She is in the LDCT screening protocol - first CT was last year. Anxiety  Presents for follow-up visit. Symptoms include shortness of breath. Patient reports no chest pain, dizziness, nervous/anxious behavior or palpitations. Symptoms occur occasionally. The quality of sleep is good.    Ankle OA - got a cortisone injection yesterday and started on Mobic.  COPD - using spiriva daily and albuterol as needed.    Review of Systems  Constitutional: Negative for chills, fatigue and fever.  HENT: Negative for congestion, hearing loss, tinnitus, trouble swallowing and voice change.   Eyes: Negative for visual disturbance.  Respiratory: Positive for chest tightness and shortness of breath. Negative for cough and wheezing.   Cardiovascular: Negative for chest pain, palpitations and leg swelling.  Gastrointestinal: Negative for abdominal pain, constipation, diarrhea and vomiting.  Endocrine: Negative for polydipsia and polyuria.  Genitourinary: Negative for dysuria, frequency, genital sores, vaginal bleeding and vaginal discharge.  Musculoskeletal: Positive for arthralgias (ankle pain). Negative for gait problem and joint swelling.  Skin: Negative for color change and rash.  Neurological: Negative for dizziness, tremors, light-headedness and headaches.  Hematological: Negative for adenopathy. Does not bruise/bleed easily.  Psychiatric/Behavioral: Negative for dysphoric mood and sleep disturbance. The patient is not nervous/anxious.     Patient Active Problem List   Diagnosis Date Noted  . Tobacco  use disorder, moderate, in sustained remission 04/23/2018  . Atherosclerosis of aorta ([place]) 04/23/2018  . Stage 2 moderate COPD by GOLD classification ([place]) 04/22/2017  . Generalized anxiety disorder 01/18/2015  . Asymptomatic bacteriuria 01/10/2015  . Fibrocystic breast disease (FCBD) 01/10/2015    No Known Allergies  Past Surgical History:  Procedure Laterality Date  . NEPHROLITHOTOMY  2012   Dr. Marcelline Mates  . PARTIAL HYSTERECTOMY  2001   ovaries remain  . TONSILLECTOMY      Social History   Tobacco Use  . Smoking status: Former Smoker    Packs/day: 2.00    Years: 15.00    Pack years: 30.00    Types: Cigarettes    Last attempt to quit: 02/18/2014    Years since quitting: 4.1  . Smokeless tobacco: Never Used  Substance Use Topics  . Alcohol use: Yes    Alcohol/week: 0.0 standard drinks    Comment: occasional  . Drug use: No     Medication list has been reviewed and updated.  Current Meds  Medication Sig  . ALPRAZolam (XANAX) 1 MG tablet TAKE 1 TABLET BY MOUTH TWICE DAILY AS NEEDED FOR ANXIETY  . meloxicam (MOBIC) 15 MG tablet Take 1 tablet by mouth daily as needed.  . nystatin cream (MYCOSTATIN) Apply 1 application topically 2 (two) times daily.  Marland Kitchen PROAIR HFA 108 (90 Base) MCG/ACT inhaler INL 2 INHALATIONS ITL Q 6 H PRF WHZ  . SPIRIVA HANDIHALER 18 MCG inhalation capsule     PHQ 2/9 Scores 04/23/2018 03/02/2018 04/22/2017 12/20/2015  PHQ - 2 Score 0 0 1 4  PHQ- 9 Score - - 1 7    Physical Exam Vitals signs and nursing note reviewed.  Constitutional:      General: She is not in acute distress.    Appearance: She is well-developed.  HENT:     Head: Normocephalic and atraumatic.     Right Ear: Tympanic membrane and ear canal normal.     Left Ear: Tympanic membrane and ear canal normal.     Nose:     Right Sinus: No maxillary sinus tenderness.     Left Sinus: No maxillary sinus tenderness.     Mouth/Throat:     Pharynx: Uvula midline.  Eyes:      General: No scleral icterus.       Right eye: No discharge.        Left eye: No discharge.     Conjunctiva/sclera: Conjunctivae normal.  Neck:     Musculoskeletal: Normal range of motion. No erythema.     Thyroid: No thyromegaly.     Vascular: No carotid bruit.  Cardiovascular:     Rate and Rhythm: Normal rate and regular rhythm.     Pulses: Normal pulses.     Heart sounds: Normal heart sounds.  Pulmonary:     Effort: Pulmonary effort is normal. No respiratory distress.     Breath sounds: No wheezing.  Chest:     Breasts:        Right: No mass, nipple discharge, skin change or tenderness.        Left: No mass, nipple discharge, skin change or tenderness.  Abdominal:     General: Bowel sounds are normal.     Palpations: Abdomen is soft.     Tenderness: There is no abdominal tenderness.  Musculoskeletal:     Right knee: Normal.     Left knee: Normal.     Right ankle: She exhibits decreased range of motion.  Lymphadenopathy:     Cervical: No cervical adenopathy.  Skin:    General: Skin is warm and dry.     Findings: No rash.  Neurological:     Mental Status: She is alert and oriented to person, place, and time.     Cranial Nerves: No cranial nerve deficit.     Sensory: No sensory deficit.     Deep Tendon Reflexes: Reflexes are normal and symmetric.  Psychiatric:        Speech: Speech normal.        Behavior: Behavior normal.        Thought Content: Thought content normal.    Wt Readings from Last 3 Encounters:  04/23/18 175 lb (79.4 kg)  03/02/18 177 lb 12.8 oz (80.6 kg)  05/08/17 170 lb (77.1 kg)    BP 132/76   Pulse 80   Ht 5\' 6"  (1.676 m)   Wt 175 lb (79.4 kg)   SpO2 98%   BMI 28.25 kg/m   Assessment and Plan: 1. Annual physical exam Normal exam Continue exercise, healthy diet - CBC with Differential/Platelet - Comprehensive metabolic panel - TSH - POCT urinalysis dipstick  2. Encounter for screening mammogram for breast cancer Completed in December  - continue annually  3. Stage 2 moderate COPD by GOLD classification ([place]) Stable, followed by Pulmonary but due to LDCT and not yet scheduled - AMB  Referral to [facility]  4. Generalized anxiety disorder Mild, uses xanax about 3 times per week  5. Tobacco use disorder, moderate, in sustained remission Remains tobacco free  6. Atherosclerosis of aorta ([place]) Discussed rational for lower LDL < 70 and she agrees to lipitor Return in 6 months for  labs only if medication is tolerated - Lipid panel - atorvastatin (LIPITOR) 10 MG tablet; Take 1 tablet (10 mg total) by mouth daily.  Dispense: 90 tablet; Refill: 1 - Comprehensive metabolic panel; Future - Lipid panel; Future   Partially dictated using Editor, commissioning. Any errors are unintentional.  Halina Maidens, MD [facility]  04/23/2018

## 2018-04-24 ENCOUNTER — Telehealth: Payer: Self-pay | Admitting: *Deleted

## 2018-04-24 ENCOUNTER — Encounter: Payer: Self-pay | Admitting: *Deleted

## 2018-04-24 ENCOUNTER — Telehealth: Payer: Self-pay

## 2018-04-24 DIAGNOSIS — Z122 Encounter for screening for malignant neoplasm of respiratory organs: Secondary | ICD-10-CM

## 2018-04-24 LAB — CBC WITH DIFFERENTIAL/PLATELET
Basophils Absolute: 0 10*3/uL (ref 0.0–0.2)
Basos: 1 %
EOS (ABSOLUTE): 0 10*3/uL (ref 0.0–0.4)
Eos: 0 %
Hematocrit: 41.1 % (ref 34.0–46.6)
Hemoglobin: 14.4 g/dL (ref 11.1–15.9)
IMMATURE GRANULOCYTES: 0 %
Immature Grans (Abs): 0 10*3/uL (ref 0.0–0.1)
LYMPHS: 32 %
Lymphocytes Absolute: 2.3 10*3/uL (ref 0.7–3.1)
MCH: 32.2 pg (ref 26.6–33.0)
MCHC: 35 g/dL (ref 31.5–35.7)
MCV: 92 fL (ref 79–97)
MONOCYTES: 8 %
Monocytes Absolute: 0.6 10*3/uL (ref 0.1–0.9)
NEUTROS ABS: 4.3 10*3/uL (ref 1.4–7.0)
Neutrophils: 59 %
PLATELETS: 306 10*3/uL (ref 150–450)
RBC: 4.47 x10E6/uL (ref 3.77–5.28)
RDW: 12 % (ref 11.7–15.4)
WBC: 7.2 10*3/uL (ref 3.4–10.8)

## 2018-04-24 LAB — COMPREHENSIVE METABOLIC PANEL
ALK PHOS: 96 IU/L (ref 39–117)
ALT: 19 IU/L (ref 0–32)
AST: 20 IU/L (ref 0–40)
Albumin/Globulin Ratio: 2 (ref 1.2–2.2)
Albumin: 4.7 g/dL (ref 3.8–4.9)
BUN/Creatinine Ratio: 14 (ref 9–23)
BUN: 12 mg/dL (ref 6–24)
Bilirubin Total: 0.3 mg/dL (ref 0.0–1.2)
CALCIUM: 10.3 mg/dL — AB (ref 8.7–10.2)
CO2: 26 mmol/L (ref 20–29)
CREATININE: 0.83 mg/dL (ref 0.57–1.00)
Chloride: 100 mmol/L (ref 96–106)
GFR calc Af Amer: 91 mL/min/{1.73_m2} (ref >59)
GFR, EST NON AFRICAN AMERICAN: 79 mL/min/{1.73_m2} (ref >59)
GLUCOSE: 92 mg/dL (ref 65–99)
Globulin, Total: 2.3 g/dL (ref 1.5–4.5)
Potassium: 4.3 mmol/L (ref 3.5–5.2)
Sodium: 138 mmol/L (ref 134–144)
Total Protein: 7 g/dL (ref 6.0–8.5)

## 2018-04-24 LAB — LIPID PANEL
CHOL/HDL RATIO: 2.6 ratio (ref 0.0–4.4)
CHOLESTEROL TOTAL: 192 mg/dL (ref 100–199)
HDL: 74 mg/dL (ref >39)
LDL CALC: 96 mg/dL (ref 0–99)
TRIGLYCERIDES: 112 mg/dL (ref 0–149)
VLDL CHOLESTEROL CAL: 22 mg/dL (ref 5–40)

## 2018-04-24 LAB — TSH: TSH: 0.734 u[IU]/mL (ref 0.450–4.500)

## 2018-04-24 NOTE — Telephone Encounter (Signed)
Call pt regarding lung screening. Pt is a former smoker. Per pt scan can be any time on any day. Pt denies any new health issues at this time.

## 2018-04-24 NOTE — Telephone Encounter (Signed)
Patient has been notified that the annual lung cancer screening low dose CT scan is due currently or will be in the near future.  Confirmed that the patient is within the age range of 62-80, and asymptomatic, and currently exhibits no signs or symptoms of lung cancer.  Patient denies illness that would prevent curative treatment for lung cancer if found.  Verified smoking history, former smoker quit 2016 with 60 pkyr hx .  The shared decision making visit was completed on 05-08-17.  Patient is agreeable for the CT scan to be scheduled.  Will call patient back with date and time of appointment.

## 2018-04-29 ENCOUNTER — Telehealth: Payer: Self-pay | Admitting: *Deleted

## 2018-04-29 NOTE — Telephone Encounter (Signed)
Called pt to inform her of her appt for ldct screening on Monday 05/11/2018 @ 9:35am here @ [facility], voiced understanding.

## 2018-05-07 ENCOUNTER — Telehealth: Payer: Self-pay | Admitting: *Deleted

## 2018-05-07 NOTE — Telephone Encounter (Signed)
Patient notified/message left to notify patient that due to current restrictions lung screening appointments are cancelled and patient will be contacted regarding rescheduling.  

## 2018-05-11 ENCOUNTER — Ambulatory Visit: Payer: BLUE CROSS/BLUE SHIELD

## 2018-05-11 ENCOUNTER — Other Ambulatory Visit: Payer: Self-pay | Admitting: Specialist

## 2018-05-11 DIAGNOSIS — R0602 Shortness of breath: Secondary | ICD-10-CM

## 2018-06-29 ENCOUNTER — Telehealth: Payer: Self-pay | Admitting: *Deleted

## 2018-06-29 NOTE — Telephone Encounter (Signed)
Patient has been notified that annual lung cancer screening low dose CT scan is due currently or will be in near future. Confirmed that patient is within the age range of 55-77, and asymptomatic, (no signs or symptoms of lung cancer). Patient denies illness that would prevent curative treatment for lung cancer if found. Verified smoking history, (former, quit 2016, 60 pack year). The shared decision making visit was done 05/08/17. Patient is agreeable for CT scan being scheduled.

## 2018-07-02 ENCOUNTER — Ambulatory Visit: Admission: RE | Admit: 2018-07-02 | Payer: BLUE CROSS/BLUE SHIELD | Source: Ambulatory Visit

## 2018-07-09 ENCOUNTER — Other Ambulatory Visit: Payer: Self-pay | Admitting: Internal Medicine

## 2018-07-10 ENCOUNTER — Ambulatory Visit
Admission: RE | Admit: 2018-07-10 | Discharge: 2018-07-10 | Disposition: A | Discharge status: Home / Self Care | Payer: BLUE CROSS/BLUE SHIELD | Source: Ambulatory Visit | Attending: Oncology | Admitting: Oncology

## 2018-07-10 ENCOUNTER — Other Ambulatory Visit: Payer: Self-pay

## 2018-07-10 DIAGNOSIS — Z87891 Personal history of nicotine dependence: Secondary | ICD-10-CM | POA: Diagnosis not present

## 2018-07-10 DIAGNOSIS — Z122 Encounter for screening for malignant neoplasm of respiratory organs: Secondary | ICD-10-CM | POA: Diagnosis not present

## 2018-07-14 ENCOUNTER — Encounter: Payer: Self-pay | Admitting: *Deleted

## 2018-10-28 ENCOUNTER — Ambulatory Visit: Payer: BLUE CROSS/BLUE SHIELD | Admitting: Internal Medicine

## 2018-12-30 ENCOUNTER — Other Ambulatory Visit: Payer: Self-pay | Admitting: Internal Medicine

## 2018-12-30 DIAGNOSIS — Z1231 Encounter for screening mammogram for malignant neoplasm of breast: Secondary | ICD-10-CM

## 2019-01-24 ENCOUNTER — Other Ambulatory Visit: Payer: Self-pay | Admitting: Internal Medicine

## 2019-01-24 DIAGNOSIS — I700 Atherosclerosis of aorta: Secondary | ICD-10-CM

## 2019-02-10 DIAGNOSIS — Z20828 Contact with and (suspected) exposure to other viral communicable diseases: Secondary | ICD-10-CM | POA: Diagnosis not present

## 2019-03-03 ENCOUNTER — Other Ambulatory Visit: Payer: Self-pay | Admitting: Internal Medicine

## 2019-03-03 ENCOUNTER — Ambulatory Visit
Admission: RE | Admit: 2019-03-03 | Discharge: 2019-03-03 | Disposition: A | Discharge status: Home / Self Care | Payer: BC Managed Care – PPO | Source: Ambulatory Visit | Attending: Internal Medicine | Admitting: Internal Medicine

## 2019-03-03 DIAGNOSIS — Z1231 Encounter for screening mammogram for malignant neoplasm of breast: Secondary | ICD-10-CM | POA: Diagnosis not present

## 2019-03-04 DIAGNOSIS — R0600 Dyspnea, unspecified: Secondary | ICD-10-CM | POA: Diagnosis not present

## 2019-03-04 DIAGNOSIS — J449 Chronic obstructive pulmonary disease, unspecified: Secondary | ICD-10-CM | POA: Diagnosis not present

## 2019-04-27 ENCOUNTER — Encounter: Payer: Self-pay | Admitting: Internal Medicine

## 2019-04-27 ENCOUNTER — Ambulatory Visit (INDEPENDENT_AMBULATORY_CARE_PROVIDER_SITE_OTHER): Payer: BC Managed Care – PPO | Admitting: Internal Medicine

## 2019-04-27 ENCOUNTER — Other Ambulatory Visit: Payer: Self-pay

## 2019-04-27 VITALS — BP 120/78 | HR 75 | Temp 98.4°F | Ht 66.0 in | Wt 179.0 lb

## 2019-04-27 DIAGNOSIS — J449 Chronic obstructive pulmonary disease, unspecified: Secondary | ICD-10-CM

## 2019-04-27 DIAGNOSIS — I700 Atherosclerosis of aorta: Secondary | ICD-10-CM

## 2019-04-27 DIAGNOSIS — Z1231 Encounter for screening mammogram for malignant neoplasm of breast: Secondary | ICD-10-CM

## 2019-04-27 DIAGNOSIS — R8271 Bacteriuria: Secondary | ICD-10-CM

## 2019-04-27 DIAGNOSIS — Z0000 Encounter for general adult medical examination without abnormal findings: Secondary | ICD-10-CM | POA: Diagnosis not present

## 2019-04-27 DIAGNOSIS — Z1211 Encounter for screening for malignant neoplasm of colon: Secondary | ICD-10-CM

## 2019-04-27 DIAGNOSIS — F411 Generalized anxiety disorder: Secondary | ICD-10-CM | POA: Diagnosis not present

## 2019-04-27 LAB — POCT URINALYSIS DIPSTICK
Bilirubin, UA: NEGATIVE
Glucose, UA: NEGATIVE
Ketones, UA: NEGATIVE
Nitrite, UA: POSITIVE
Protein, UA: NEGATIVE
Spec Grav, UA: 1.01 (ref 1.010–1.025)
Urobilinogen, UA: 0.2 E.U./dL
pH, UA: 5 (ref 5.0–8.0)

## 2019-04-27 MED ORDER — ALPRAZOLAM 1 MG PO TABS: 1.0000 mg | ORAL_TABLET | Freq: Two times a day (BID) | ORAL | 5 refills | Status: DC | PRN

## 2019-04-27 NOTE — Progress Notes (Signed)
Date:  04/27/2019   Name:  Denise Fernandez   DOB:  1960/10/25   MRN:  [id number]   Chief Complaint: Annual Exam (no breast exam -no pap) Denise Fernandez is a 59 y.o. female who presents today for her Complete Annual Exam. She feels fairly well. She reports exercising biking some and walking. She reports she is sleeping fairly well. She denies breast issues.  Recently had mammogram.  Mammogram  02/2019 Pap discontinued Colonoscopy  12/2013 - polyps Immunization History  Administered Date(s) Administered  . Influenza Inj Mdck Quad Pf 11/28/2017  . Influenza,inj,Quad PF,6+ Mos 11/21/2016  . Influenza-Unspecified 11/19/2014, 11/21/2016, 11/28/2017, 11/18/2018  . Pneumococcal Polysaccharide-23 04/22/2017   COPD - per pulmonary note 02/2019: Hx of copd, ex smoker, strong fhx of lung cancer, here for recheck. Presently no increase sob, she is on the inhalers below. Still working, Stage manager. She denies chest pain, ectopy, edema or calf pain. No recent pain. No fever, chills, no headaches, no rashes or lost of taste/smell. No gerd, mild rhinitis. No swallowing or choking spells. There is pet ( dog). Also take care of cats.  She does snore, no observed apnea, her energy good. Did not get the home sleep study. Not interested.  Aortic atherosclerosis - seen on CT scan chest.  Started on atorvastatin last year. HPI  Lab Results  Component Value Date   CREATININE 0.83 04/23/2018   BUN 12 04/23/2018   NA 138 04/23/2018   K 4.3 04/23/2018   CL 100 04/23/2018   CO2 26 04/23/2018   Lab Results  Component Value Date   CHOL 192 04/23/2018   HDL 74 04/23/2018   LDLCALC 96 04/23/2018   TRIG 112 04/23/2018   CHOLHDL 2.6 04/23/2018   Lab Results  Component Value Date   TSH 0.734 04/23/2018   No results found for: HGBA1C   Review of Systems  Constitutional: Negative for chills, fatigue and fever.  HENT: Negative for congestion, hearing loss, tinnitus, trouble swallowing and  voice change.   Eyes: Negative for visual disturbance.  Respiratory: Negative for cough, chest tightness, shortness of breath and wheezing.   Cardiovascular: Negative for chest pain, palpitations and leg swelling.  Gastrointestinal: Negative for abdominal pain, constipation, diarrhea and vomiting.  Endocrine: Negative for polydipsia and polyuria.  Genitourinary: Negative for dysuria, frequency, genital sores, vaginal bleeding and vaginal discharge.  Musculoskeletal: Negative for arthralgias, gait problem and joint swelling.  Skin: Negative for color change and rash.  Neurological: Negative for dizziness, tremors, light-headedness and headaches.  Hematological: Negative for adenopathy. Does not bruise/bleed easily.  Psychiatric/Behavioral: Negative for dysphoric mood and sleep disturbance. The patient is not nervous/anxious.     Patient Active Problem List   Diagnosis Date Noted  . Tobacco use disorder, moderate, in sustained remission 04/23/2018  . Atherosclerosis of aorta ([place]) 04/23/2018  . Stage 2 moderate COPD by GOLD classification ([place]) 04/22/2017  . Generalized anxiety disorder 01/18/2015  . Asymptomatic bacteriuria 01/10/2015  . Fibrocystic breast disease ([facility]) 01/10/2015    No Known Allergies  Past Surgical History:  Procedure Laterality Date  . NEPHROLITHOTOMY  2012   Dr. Marcelline Mates  . PARTIAL HYSTERECTOMY  2001   ovaries remain  . TONSILLECTOMY      Social History   Tobacco Use  . Smoking status: Former Smoker    Packs/day: 2.00    Years: 30.00    Pack years: 60.00    Types: Cigarettes    Quit date: 02/18/2014  Years since quitting: 5.1  . Smokeless tobacco: Never Used  Substance Use Topics  . Alcohol use: Yes    Alcohol/week: 0.0 standard drinks    Comment: occasional  . Drug use: No     Medication list has been reviewed and updated.  Current Meds  Medication Sig  . ALPRAZolam (XANAX) 1 MG tablet TAKE 1 TABLET(1 MG) BY MOUTH TWICE DAILY AS  NEEDED FOR ANXIETY  . atorvastatin (LIPITOR) 10 MG tablet TAKE 1 TABLET(10 MG) BY MOUTH DAILY  . nystatin cream (MYCOSTATIN) Apply 1 application topically 2 (two) times daily.  Marland Kitchen PROAIR HFA 108 (90 Base) MCG/ACT inhaler INL 2 INHALATIONS ITL Q 6 H PRF WHZ  . SPIRIVA HANDIHALER 18 MCG inhalation capsule     PHQ 2/9 Scores 04/27/2019 04/23/2018 03/02/2018 04/22/2017  PHQ - 2 Score 1 0 0 1  PHQ- 9 Score 3 - - 1    BP Readings from Last 3 Encounters:  04/27/19 120/78  04/23/18 132/76  03/02/18 132/86    Physical Exam Vitals and nursing note reviewed.  Constitutional:      General: She is not in acute distress.    Appearance: She is well-developed.  HENT:     Head: Normocephalic and atraumatic.     Right Ear: Tympanic membrane and ear canal normal.     Left Ear: Tympanic membrane and ear canal normal.     Nose:     Right Sinus: No maxillary sinus tenderness.     Left Sinus: No maxillary sinus tenderness.  Eyes:     General: No scleral icterus.       Right eye: No discharge.        Left eye: No discharge.     Conjunctiva/sclera: Conjunctivae normal.  Neck:     Thyroid: No thyromegaly.     Vascular: No carotid bruit.  Cardiovascular:     Rate and Rhythm: Normal rate and regular rhythm.     Pulses: Normal pulses.     Heart sounds: Normal heart sounds.  Pulmonary:     Effort: Pulmonary effort is normal. No respiratory distress.     Breath sounds: No wheezing.  Abdominal:     General: Bowel sounds are normal.     Palpations: Abdomen is soft.     Tenderness: There is no abdominal tenderness.  Musculoskeletal:        General: Normal range of motion.     Cervical back: Normal range of motion. No erythema.     Right lower leg: No edema.     Left lower leg: No edema.  Lymphadenopathy:     Cervical: No cervical adenopathy.  Skin:    General: Skin is warm and dry.     Capillary Refill: Capillary refill takes less than 2 seconds.     Findings: No rash.          Comments: Rough  raised lesion on dorsum of hand  Neurological:     Mental Status: She is alert and oriented to person, place, and time.     Cranial Nerves: No cranial nerve deficit.     Sensory: No sensory deficit.     Deep Tendon Reflexes: Reflexes are normal and symmetric.  Psychiatric:        Speech: Speech normal.        Behavior: Behavior normal.        Thought Content: Thought content normal.     Wt Readings from Last 3 Encounters:  04/27/19 179 lb (81.2 kg)  07/10/18 175 lb (79.4 kg)  04/23/18 175 lb (79.4 kg)    BP 120/78   Pulse 75   Temp 98.4 F (36.9 C) (Temporal)   Ht 5\' 6"  (1.676 m)   Wt 179 lb (81.2 kg)   SpO2 99%   BMI 28.89 kg/m   Assessment and Plan: 1. Annual physical exam Normal exam Continue exercise and diet - TSH - POCT urinalysis dipstick - 3+ bacteria noted again  2. Encounter for screening mammogram for breast cancer Recently completed  3. Stage 2 moderate COPD by GOLD classification ([place]) Doing fairly well, remains tobacco free Due for LDCT - CBC with Differential/Platelet - Comprehensive metabolic panel  4. Atherosclerosis of aorta ([place]) Now on statin therapy - Lipid panel  5. Colon cancer screening Due for 5 yr follow up with Dr. Allen Norris - Ambulatory referral to Gastroenterology  6. Generalized anxiety disorder Clinically stable - ALPRAZolam (XANAX) 1 MG tablet; Take 1 tablet (1 mg total) by mouth 2 (two) times daily as needed for anxiety.  Dispense: 30 tablet; Refill: 5  7. Asymptomatic bacteriuria Persistently noted on UA Will not treat unless symptomatic   Partially dictated using Editor, commissioning. Any errors are unintentional.  Halina Maidens, MD [facility]  04/27/2019

## 2019-04-28 LAB — LIPID PANEL
Chol/HDL Ratio: 2.1 ratio (ref 0.0–4.4)
Cholesterol, Total: 134 mg/dL (ref 100–199)
HDL: 63 mg/dL (ref >39)
LDL Chol Calc (NIH): 54 mg/dL (ref 0–99)
Triglycerides: 90 mg/dL (ref 0–149)
VLDL Cholesterol Cal: 17 mg/dL (ref 5–40)

## 2019-04-28 LAB — CBC WITH DIFFERENTIAL/PLATELET
Basophils Absolute: 0 10*3/uL (ref 0.0–0.2)
Basos: 1 %
EOS (ABSOLUTE): 0.1 10*3/uL (ref 0.0–0.4)
Eos: 2 %
Hematocrit: 40.9 % (ref 34.0–46.6)
Hemoglobin: 14.3 g/dL (ref 11.1–15.9)
Immature Grans (Abs): 0 10*3/uL (ref 0.0–0.1)
Immature Granulocytes: 0 %
Lymphocytes Absolute: 2.2 10*3/uL (ref 0.7–3.1)
Lymphs: 45 %
MCH: 32.7 pg (ref 26.6–33.0)
MCHC: 35 g/dL (ref 31.5–35.7)
MCV: 94 fL (ref 79–97)
Monocytes Absolute: 0.4 10*3/uL (ref 0.1–0.9)
Monocytes: 8 %
Neutrophils Absolute: 2.2 10*3/uL (ref 1.4–7.0)
Neutrophils: 44 %
Platelets: 295 10*3/uL (ref 150–450)
RBC: 4.37 x10E6/uL (ref 3.77–5.28)
RDW: 11.7 % (ref 11.7–15.4)
WBC: 4.8 10*3/uL (ref 3.4–10.8)

## 2019-04-28 LAB — COMPREHENSIVE METABOLIC PANEL
ALT: 27 IU/L (ref 0–32)
AST: 19 IU/L (ref 0–40)
Albumin/Globulin Ratio: 2 (ref 1.2–2.2)
Albumin: 4.4 g/dL (ref 3.8–4.9)
Alkaline Phosphatase: 107 IU/L (ref 39–117)
BUN/Creatinine Ratio: 17 (ref 9–23)
BUN: 13 mg/dL (ref 6–24)
Bilirubin Total: 0.3 mg/dL (ref 0.0–1.2)
CO2: 24 mmol/L (ref 20–29)
Calcium: 9.9 mg/dL (ref 8.7–10.2)
Chloride: 102 mmol/L (ref 96–106)
Creatinine, Ser: 0.78 mg/dL (ref 0.57–1.00)
GFR calc Af Amer: 98 mL/min/{1.73_m2} (ref >59)
GFR calc non Af Amer: 85 mL/min/{1.73_m2} (ref >59)
Globulin, Total: 2.2 g/dL (ref 1.5–4.5)
Glucose: 89 mg/dL (ref 65–99)
Potassium: 4.8 mmol/L (ref 3.5–5.2)
Sodium: 140 mmol/L (ref 134–144)
Total Protein: 6.6 g/dL (ref 6.0–8.5)

## 2019-04-28 LAB — TSH: TSH: 2.16 u[IU]/mL (ref 0.450–4.500)

## 2019-04-29 ENCOUNTER — Telehealth: Payer: Self-pay

## 2019-04-29 ENCOUNTER — Other Ambulatory Visit: Payer: Self-pay

## 2019-04-29 DIAGNOSIS — D2261 Melanocytic nevi of right upper limb, including shoulder: Secondary | ICD-10-CM | POA: Diagnosis not present

## 2019-04-29 DIAGNOSIS — C44629 Squamous cell carcinoma of skin of left upper limb, including shoulder: Secondary | ICD-10-CM | POA: Diagnosis not present

## 2019-04-29 DIAGNOSIS — D225 Melanocytic nevi of trunk: Secondary | ICD-10-CM | POA: Diagnosis not present

## 2019-04-29 DIAGNOSIS — D485 Neoplasm of uncertain behavior of skin: Secondary | ICD-10-CM | POA: Diagnosis not present

## 2019-04-29 DIAGNOSIS — D2262 Melanocytic nevi of left upper limb, including shoulder: Secondary | ICD-10-CM | POA: Diagnosis not present

## 2019-04-29 DIAGNOSIS — Z1211 Encounter for screening for malignant neoplasm of colon: Secondary | ICD-10-CM

## 2019-04-29 DIAGNOSIS — D2272 Melanocytic nevi of left lower limb, including hip: Secondary | ICD-10-CM | POA: Diagnosis not present

## 2019-04-29 DIAGNOSIS — Z86010 Personal history of colonic polyps: Secondary | ICD-10-CM

## 2019-04-29 MED ORDER — GOLYTELY 236 G PO SOLR: 4000.0000 mL | Freq: Once | ORAL | 0 refills | Status: AC

## 2019-04-29 NOTE — Telephone Encounter (Signed)
Gastroenterology Pre-Procedure Review  Request Date: Friday 05/31/19 Requesting Physician: Dr. Allen Norris  PATIENT REVIEW QUESTIONS: The patient responded to the following health history questions as indicated:    1. Are you having any GI issues? no 2. Do you have a personal history of Polyps? yes (2015 with Dr. Allen Norris) 3. Do you have a family history of Colon Cancer or Polyps? no 4. Diabetes Mellitus? no 5. Joint replacements in the past 12 months?no 6. Major health problems in the past 3 months?no 7. Any artificial heart valves, MVP, or defibrillator?no    MEDICATIONS & ALLERGIES:    Patient reports the following regarding taking any anticoagulation/antiplatelet therapy:   Plavix, Coumadin, Eliquis, Xarelto, Lovenox, Pradaxa, Brilinta, or Effient? no Aspirin? no  Patient confirms/reports the following medications:  Current Outpatient Medications  Medication Sig Dispense Refill  . ALPRAZolam (XANAX) 1 MG tablet Take 1 tablet (1 mg total) by mouth 2 (two) times daily as needed for anxiety. 30 tablet 5  . atorvastatin (LIPITOR) 10 MG tablet TAKE 1 TABLET(10 MG) BY MOUTH DAILY 90 tablet 1  . meloxicam (MOBIC) 15 MG tablet Take 1 tablet by mouth daily as needed.    . nystatin cream (MYCOSTATIN) Apply 1 application topically 2 (two) times daily. 30 g 1  . PROAIR HFA 108 (90 Base) MCG/ACT inhaler INL 2 INHALATIONS ITL Q 6 H PRF WHZ  12  . SPIRIVA HANDIHALER 18 MCG inhalation capsule   10   No current facility-administered medications for this visit.    Patient confirms/reports the following allergies:  No Known Allergies  No orders of the defined types were placed in this encounter.   AUTHORIZATION INFORMATION Primary Insurance: 1D#: Group #:  Secondary Insurance: 1D#: Group #:  SCHEDULE INFORMATION: Date: Friday 05/31/19 Time: Location:[facility]

## 2019-05-12 DIAGNOSIS — C44629 Squamous cell carcinoma of skin of left upper limb, including shoulder: Secondary | ICD-10-CM | POA: Diagnosis not present

## 2019-05-19 ENCOUNTER — Encounter: Payer: Self-pay | Admitting: Gastroenterology

## 2019-05-26 ENCOUNTER — Other Ambulatory Visit
Admission: RE | Admit: 2019-05-26 | Discharge: 2019-05-26 | Disposition: A | Discharge status: Home / Self Care | Payer: BC Managed Care – PPO | Source: Ambulatory Visit | Attending: Gastroenterology | Admitting: Gastroenterology

## 2019-05-26 DIAGNOSIS — Z20822 Contact with and (suspected) exposure to covid-19: Secondary | ICD-10-CM | POA: Diagnosis not present

## 2019-05-26 DIAGNOSIS — Z01812 Encounter for preprocedural laboratory examination: Secondary | ICD-10-CM | POA: Insufficient documentation

## 2019-05-26 LAB — SARS CORONAVIRUS 2 (TAT 6-24 HRS): SARS Coronavirus 2: NEGATIVE

## 2019-05-27 ENCOUNTER — Other Ambulatory Visit: Payer: BC Managed Care – PPO

## 2019-05-28 NOTE — Discharge Instructions (Signed)
General Anesthesia, Adult, Care After This sheet gives you information about how to care for yourself after your procedure. Your health care provider may also give you more specific instructions. If you have problems or questions, contact your health care provider. What can I expect after the procedure? After the procedure, the following side effects are common:  Pain or discomfort at the IV site.  Nausea.  Vomiting.  Sore throat.  Trouble concentrating.  Feeling cold or chills.  Weak or tired.  Sleepiness and fatigue.  Soreness and body aches. These side effects can affect parts of the body that were not involved in surgery. Follow these instructions at home:  For at least 24 hours after the procedure:  Have a responsible adult stay with you. It is important to have someone help care for you until you are awake and alert.  Rest as needed.  Do not: ? Participate in activities in which you could fall or become injured. ? Drive. ? Use heavy machinery. ? Drink alcohol. ? Take sleeping pills or medicines that cause drowsiness. ? Make important decisions or sign legal documents. ? Take care of children on your own. Eating and drinking  Follow any instructions from your health care provider about eating or drinking restrictions.  When you feel hungry, start by eating small amounts of foods that are soft and easy to digest (bland), such as toast. Gradually return to your regular diet.  Drink enough fluid to keep your urine pale yellow.  If you vomit, rehydrate by drinking water, juice, or clear broth. General instructions  If you have sleep apnea, surgery and certain medicines can increase your risk for breathing problems. Follow instructions from your health care provider about wearing your sleep device: ? Anytime you are sleeping, including during daytime naps. ? While taking prescription pain medicines, sleeping medicines, or medicines that make you drowsy.  Return to  your normal activities as told by your health care provider. Ask your health care provider what activities are safe for you.  Take over-the-counter and prescription medicines only as told by your health care provider.  If you smoke, do not smoke without supervision.  Keep all follow-up visits as told by your health care provider. This is important. Contact a health care provider if:  You have nausea or vomiting that does not get better with medicine.  You cannot eat or drink without vomiting.  You have pain that does not get better with medicine.  You are unable to pass urine.  You develop a skin rash.  You have a fever.  You have redness around your IV site that gets worse. Get help right away if:  You have difficulty breathing.  You have chest pain.  You have blood in your urine or stool, or you vomit blood. Summary  After the procedure, it is common to have a sore throat or nausea. It is also common to feel tired.  Have a responsible adult stay with you for the first 24 hours after general anesthesia. It is important to have someone help care for you until you are awake and alert.  When you feel hungry, start by eating small amounts of foods that are soft and easy to digest (bland), such as toast. Gradually return to your regular diet.  Drink enough fluid to keep your urine pale yellow.  Return to your normal activities as told by your health care provider. Ask your health care provider what activities are safe for you. This information is not   intended to replace advice given to you by your health care provider. Make sure you discuss any questions you have with your health care provider. Document Revised: 02/07/2017 Document Reviewed: 09/20/2016 Elsevier Patient Education  2020 Elsevier Inc.  

## 2019-05-30 ENCOUNTER — Other Ambulatory Visit: Payer: Self-pay | Admitting: Internal Medicine

## 2019-05-31 ENCOUNTER — Ambulatory Visit: Payer: BC Managed Care – PPO | Admitting: Anesthesiology

## 2019-05-31 ENCOUNTER — Encounter: Payer: Self-pay | Admitting: Gastroenterology

## 2019-05-31 ENCOUNTER — Ambulatory Visit
Admission: RE | Admit: 2019-05-31 | Discharge: 2019-05-31 | Disposition: A | Discharge status: Home / Self Care | Payer: BC Managed Care – PPO | Attending: Gastroenterology | Admitting: Gastroenterology

## 2019-05-31 ENCOUNTER — Other Ambulatory Visit: Payer: Self-pay

## 2019-05-31 ENCOUNTER — Encounter
Admission: RE | Disposition: A | Discharge status: Home / Self Care | Payer: Self-pay | Source: Home / Self Care | Attending: Gastroenterology

## 2019-05-31 DIAGNOSIS — Z86010 Personal history of colonic polyps: Secondary | ICD-10-CM | POA: Insufficient documentation

## 2019-05-31 DIAGNOSIS — D122 Benign neoplasm of ascending colon: Secondary | ICD-10-CM | POA: Diagnosis not present

## 2019-05-31 DIAGNOSIS — K635 Polyp of colon: Secondary | ICD-10-CM | POA: Diagnosis not present

## 2019-05-31 DIAGNOSIS — D124 Benign neoplasm of descending colon: Secondary | ICD-10-CM | POA: Diagnosis not present

## 2019-05-31 DIAGNOSIS — Z1211 Encounter for screening for malignant neoplasm of colon: Secondary | ICD-10-CM | POA: Insufficient documentation

## 2019-05-31 DIAGNOSIS — D126 Benign neoplasm of colon, unspecified: Secondary | ICD-10-CM

## 2019-05-31 HISTORY — DX: Other complications of anesthesia, initial encounter: T88.59XA

## 2019-05-31 HISTORY — DX: Other cervical disc degeneration, unspecified cervical region: M50.30

## 2019-05-31 HISTORY — DX: Headache, unspecified: R51.9

## 2019-05-31 HISTORY — DX: Gastro-esophageal reflux disease without esophagitis: K21.9

## 2019-05-31 HISTORY — DX: Other cervical disc displacement, unspecified cervical region: M50.20

## 2019-05-31 HISTORY — DX: Respiratory tuberculosis unspecified: A15.9

## 2019-05-31 HISTORY — DX: Dyspnea, unspecified: R06.00

## 2019-05-31 HISTORY — PX: COLONOSCOPY WITH PROPOFOL: SHX5780

## 2019-05-31 HISTORY — DX: Chronic obstructive pulmonary disease, unspecified: J44.9

## 2019-05-31 HISTORY — PX: POLYPECTOMY: SHX5525

## 2019-05-31 SURGERY — COLONOSCOPY WITH PROPOFOL
Anesthesia: General | Site: Rectum

## 2019-05-31 MED ORDER — LACTATED RINGERS IV SOLN: 10.0000 mL/h | INTRAVENOUS | Status: DC

## 2019-05-31 MED ORDER — LACTATED RINGERS IV SOLN: INTRAVENOUS | Status: DC

## 2019-05-31 MED ORDER — ACETAMINOPHEN 325 MG PO TABS: 325.0000 mg | ORAL_TABLET | Freq: Once | ORAL | Status: DC

## 2019-05-31 MED ORDER — ACETAMINOPHEN 160 MG/5ML PO SOLN: 325.0000 mg | Freq: Once | ORAL | Status: DC

## 2019-05-31 MED ORDER — PROPOFOL 10 MG/ML IV BOLUS
INTRAVENOUS | Status: DC | PRN
  Administered 2019-05-31: 20 mg via INTRAVENOUS
  Administered 2019-05-31: 30 mg via INTRAVENOUS
  Administered 2019-05-31 (×7): 20 mg via INTRAVENOUS
  Administered 2019-05-31: 100 mg via INTRAVENOUS
  Administered 2019-05-31 (×3): 20 mg via INTRAVENOUS
  Administered 2019-05-31: 30 mg via INTRAVENOUS
  Administered 2019-05-31: 20 mg via INTRAVENOUS

## 2019-05-31 MED ORDER — LIDOCAINE HCL (CARDIAC) PF 100 MG/5ML IV SOSY
PREFILLED_SYRINGE | INTRAVENOUS | Status: DC | PRN
  Administered 2019-05-31: 20 mg via INTRAVENOUS

## 2019-05-31 MED ORDER — STERILE WATER FOR IRRIGATION IR SOLN
Status: DC | PRN
  Administered 2019-05-31: 50 mL

## 2019-05-31 SURGICAL SUPPLY — 16 items
CANISTER SUCT 1200ML W/VALVE (MISCELLANEOUS) ×2 IMPLANT
CLIP HMST11XOPN 235X2.8X (MISCELLANEOUS) IMPLANT
CLIP RESOLUTION 360 11X235 (MISCELLANEOUS) ×2
ELECT REM PT RETURN 9FT ADLT (ELECTROSURGICAL) ×2
ELECTRODE REM PT RTRN 9FT ADLT (ELECTROSURGICAL) IMPLANT
ELEVIEW  INJECTABLE COMP 10 (MISCELLANEOUS) ×1
GOWN CVR UNV OPN BCK APRN NK (MISCELLANEOUS) ×2 IMPLANT
GOWN ISOL THUMB LOOP REG UNIV (MISCELLANEOUS) ×2
INJECTABLE ELEVIEW COMP 10 (MISCELLANEOUS) IMPLANT
INJECTOR VARIJECT VIN23 (MISCELLANEOUS) IMPLANT
KIT ENDO PROCEDURE OLY (KITS) ×2 IMPLANT
SNARE SHORT THROW 13M SML OVAL (MISCELLANEOUS) ×1 IMPLANT
SNARE SPIRAL (MISCELLANEOUS) ×1 IMPLANT
TRAP ETRAP POLY (MISCELLANEOUS) ×1 IMPLANT
VARIJECT INJECTOR VIN23 (MISCELLANEOUS) ×2
WATER STERILE IRR 250ML POUR (IV SOLUTION) ×2 IMPLANT

## 2019-05-31 NOTE — Anesthesia Procedure Notes (Signed)
Procedure Name: MAC Date/Time: 05/31/2019 8:35 AM Performed by: Vanetta Shawl, CRNA Pre-anesthesia Checklist: Patient identified, Emergency Drugs available, Suction available, Timeout performed and Patient being monitored Patient Re-evaluated:Patient Re-evaluated prior to induction Oxygen Delivery Method: Nasal cannula Placement Confirmation: positive ETCO2

## 2019-05-31 NOTE — Anesthesia Postprocedure Evaluation (Signed)
Anesthesia Post Note  Patient: Psychologist, prison and probation services  Procedure(s) Performed: COLONOSCOPY WITH BIOPSY (N/A Rectum) POLYPECTOMY (N/A Rectum)     Patient location during evaluation: PACU Anesthesia Type: General Level of consciousness: awake and alert and oriented Pain management: satisfactory to patient Vital Signs Assessment: post-procedure vital signs reviewed and stable Respiratory status: spontaneous breathing, nonlabored ventilation and respiratory function stable Cardiovascular status: blood pressure returned to baseline and stable Postop Assessment: Adequate PO intake and No signs of nausea or vomiting Anesthetic complications: no    Raliegh Ip

## 2019-05-31 NOTE — Transfer of Care (Signed)
Immediate Anesthesia Transfer of Care Note  Patient: Denise Fernandez  Procedure(s) Performed: COLONOSCOPY WITH BIOPSY (N/A Rectum) POLYPECTOMY (N/A Rectum)  Patient Location: PACU  Anesthesia Type: General  Level of Consciousness: awake, alert  and patient cooperative  Airway and Oxygen Therapy: Patient Spontanous Breathing and Patient connected to supplemental oxygen  Post-op Assessment: Post-op Vital signs reviewed, Patient's Cardiovascular Status Stable, Respiratory Function Stable, Patent Airway and No signs of Nausea or vomiting  Post-op Vital Signs: Reviewed and stable  Complications: No apparent anesthesia complications

## 2019-05-31 NOTE — Op Note (Signed)
[facility] Gastroenterology Patient Name: Denise Fernandez Procedure Date: 05/31/2019 8:27 AM MRN: [id number] Account #: [id number] Date of Birth: 10-12-1960 Admit Type: Outpatient Age: 59 Room: [facility] OR ROOM 01 Gender: Female Note Status: Finalized Procedure:             Colonoscopy Indications:           High risk colon cancer surveillance: Personal history                         of colonic polyps Providers:             Lucilla Lame MD, MD Referring MD:          Halina Maidens, MD (Referring MD) Medicines:             Propofol per Anesthesia Complications:         No immediate complications. Procedure:             Pre-Anesthesia Assessment:                        - Prior to the procedure, a History and Physical was                         performed, and patient medications and allergies were                         reviewed. The patient's tolerance of previous                         anesthesia was also reviewed. The risks and benefits                         of the procedure and the sedation options and risks                         were discussed with the patient. All questions were                         answered, and informed consent was obtained. Prior                         Anticoagulants: The patient has taken no previous                         anticoagulant or antiplatelet agents. ASA Grade                         Assessment: II - A patient with mild systemic disease.                         After reviewing the risks and benefits, the patient                         was deemed in satisfactory condition to undergo the                         procedure.  After obtaining informed consent, the colonoscope was                         passed under direct vision. Throughout the procedure,                         the patient's blood pressure, pulse, and oxygen                         saturations were monitored continuously. The was                          introduced through the anus and advanced to the the                         cecum, identified by appendiceal orifice and ileocecal                         valve. The colonoscopy was performed without                         difficulty. The patient tolerated the procedure well.                         The quality of the bowel preparation was excellent. Findings:      The perianal and digital rectal examinations were normal.      A 12 mm polyp was found in the ascending colon. The polyp was sessile.       Area was successfully injected with 5 mL saline with Denise carmine for       a lift polypectomy. The polyp was removed with a hot snare. Resection       and retrieval were complete. To prevent bleeding post-intervention, two       hemostatic clips were successfully placed (MR conditional). There was no       bleeding at the end of the procedure.      A 5 mm polyp was found in the descending colon. The polyp was sessile.       The polyp was removed with a cold snare. Resection and retrieval were       complete.      Non-bleeding internal hemorrhoids were found during retroflexion. The       hemorrhoids were Grade I (internal hemorrhoids that do not prolapse). Impression:            - One 12 mm polyp in the ascending colon, removed with                         a hot snare. Resected and retrieved. Injected. Clips                         (MR conditional) were placed.                        - One 5 mm polyp in the descending colon, removed with                         a cold snare. Resected and retrieved.                        -  Non-bleeding internal hemorrhoids. Recommendation:        - Discharge patient to home.                        - Resume previous diet.                        - Continue present medications.                        - Await pathology results.                        - Repeat colonoscopy in 3 years for surveillance. Procedure Code(s):     --- Professional  ---                        [phone], Colonoscopy, flexible; with removal of                         tumor(s), polyp(s), or other lesion(s) by snare                         technique                        [id number], Colonoscopy, flexible; with directed submucosal                         injection(s), any substance Diagnosis Code(s):     --- Professional ---                        Z86.010, Personal history of colonic polyps                        K63.5, Polyp of colon CPT copyright 2019 American Medical Association. All rights reserved. The codes documented in this report are preliminary and upon coder review may  be revised to meet current compliance requirements. Lucilla Lame MD, MD 05/31/2019 9:10:41 AM This report has been signed electronically. Number of Addenda: 0 Note Initiated On: 05/31/2019 8:27 AM Scope Withdrawal Time: 0 hours 24 minutes 56 seconds  Total Procedure Duration: 0 hours 29 minutes 15 seconds  Estimated Blood Loss:  Estimated blood loss: none.      [facility]

## 2019-05-31 NOTE — Anesthesia Preprocedure Evaluation (Signed)
Anesthesia Evaluation  Patient identified by MRN, date of birth, ID band Patient awake    Reviewed: Allergy & Precautions, H&P , NPO status , Patient's Chart, lab work & pertinent test results  Airway Mallampati: II  TM Distance: >3 FB Neck ROM: full    Dental no notable dental hx.    Pulmonary shortness of breath, COPD,  COPD inhaler, former smoker,    Pulmonary exam normal breath sounds clear to auscultation       Cardiovascular Normal cardiovascular exam Rhythm:regular Rate:Normal     Neuro/Psych PSYCHIATRIC DISORDERS Depression    GI/Hepatic GERD  ,  Endo/Other    Renal/GU      Musculoskeletal   Abdominal   Peds  Hematology   Anesthesia Other Findings   Reproductive/Obstetrics                             Anesthesia Physical Anesthesia Plan  ASA: II  Anesthesia Plan: General   Post-op Pain Management:    Induction: Intravenous  PONV Risk Score and Plan: 3 and Treatment may vary due to age or medical condition, Propofol infusion and TIVA  Airway Management Planned: Natural Airway  Additional Equipment:   Intra-op Plan:   Post-operative Plan:   Informed Consent: I have reviewed the patients History and Physical, chart, labs and discussed the procedure including the risks, benefits and alternatives for the proposed anesthesia with the patient or authorized representative who has indicated his/her understanding and acceptance.     Dental Advisory Given  Plan Discussed with: CRNA  Anesthesia Plan Comments:         Anesthesia Quick Evaluation

## 2019-05-31 NOTE — H&P (Signed)
Lucilla Lame, MD [facility] [address] [place], [place] [address] [phone] Fax : [phone]  Primary Care Physician:  Glean Hess, MD Primary Gastroenterologist:  Dr. Allen Norris  Pre-Procedure History & Physical: HPI:  Denise Fernandez is a 59 y.o. female is here for an colonoscopy.   Past Medical History:  Diagnosis Date  . Bulging of cervical intervertebral disc   . Complication of anesthesia    slow to wake  . COPD (chronic obstructive pulmonary disease) ([place])    Stage II  . Dyspnea   . GERD (gastroesophageal reflux disease)   . Headache   . Major depressive disorder with single episode, in partial remission ([place]) 01/18/2015  . TB (tuberculosis)    treated due to contact never tested positive    Past Surgical History:  Procedure Laterality Date  . NEPHROLITHOTOMY  2012   Dr. Marcelline Mates  . PARTIAL HYSTERECTOMY  2001   ovaries remain  . TONSILLECTOMY      Prior to Admission medications   Medication Sig Start Date End Date Taking? Authorizing Provider  ALPRAZolam Duanne Moron) 1 MG tablet Take 1 tablet (1 mg total) by mouth 2 (two) times daily as needed for anxiety. 04/27/19  Yes Glean Hess, MD  atorvastatin (LIPITOR) 10 MG tablet TAKE 1 TABLET(10 MG) BY MOUTH DAILY 01/24/19  Yes Glean Hess, MD  Boswellia-Glucosamine-Vit D (OSTEO BI-FLEX ONE PER DAY PO) Take by mouth daily.   Yes [provider]  Multiple Vitamin (MULTIVITAMIN) tablet Take 1 tablet by mouth daily.   Yes [provider]  nystatin cream (MYCOSTATIN) Apply 1 application topically 2 (two) times daily. 03/02/18  Yes Glean Hess, MD  PROAIR HFA 108 [phone] Base) MCG/ACT inhaler INL 2 INHALATIONS ITL Q 6 H PRF WHZ 04/11/16  Yes [provider]  [place] 18 MCG inhalation capsule  04/18/16  Yes [provider]  vitamin E 1000 UNIT capsule Take 1,000 Units by mouth daily.   Yes [provider]    Allergies as of 04/29/2019    . (No Known Allergies)    Family History  Problem Relation Age of Onset  . Alzheimer's disease Mother   . Heart disease Father   . Breast cancer Neg Hx     Social History   Socioeconomic History  . Marital status: Single    Spouse name: Not on file  . Number of children: Not on file  . Years of education: Not on file  . Highest education level: Not on file  Occupational History  . Not on file  Tobacco Use  . Smoking status: Former Smoker    Packs/day: 2.00    Years: 30.00    Pack years: 60.00    Types: Cigarettes    Quit date: 02/18/2014    Years since quitting: 5.2  . Smokeless tobacco: Never Used  Substance and Sexual Activity  . Alcohol use: Yes    Alcohol/week: 0.0 standard drinks    Comment: occasional  . Drug use: No  . Sexual activity: Not on file  Other Topics Concern  . Not on file  Social History Narrative  . Not on file   Social Determinants of Health   Financial Resource Strain:   . Difficulty of Paying Living Expenses:   Food Insecurity:   . Worried About Charity fundraiser in the Last Year:   . [place] in the Last Year:   Transportation Needs:   . Lack of  Transportation (Medical):   Marland Kitchen Lack of Transportation (Non-Medical):   Physical Activity:   . Days of Exercise per Week:   . Minutes of Exercise per Session:   Stress:   . Feeling of Stress :   Social Connections:   . Frequency of Communication with Friends and Family:   . Frequency of Social Gatherings with Friends and Family:   . Attends Religious Services:   . Active Member of Clubs or Organizations:   . Attends Archivist Meetings:   Marland Kitchen Marital Status:   Intimate Partner Violence:   . Fear of Current or Ex-Partner:   . Emotionally Abused:   Marland Kitchen Physically Abused:   . Sexually Abused:     Review of Systems: See HPI, otherwise negative ROS  Physical Exam: BP (!) 118/91   Pulse (!) 53   Temp 99 F (37.2 C) (Temporal)   Resp 16   Ht 5\' 6"  (1.676 m)   Wt 78.5  kg   SpO2 99%   BMI 27.92 kg/m  General:   Alert,  pleasant and cooperative in NAD Head:  Normocephalic and atraumatic. Neck:  Supple; no masses or thyromegaly. Lungs:  Clear throughout to auscultation.    Heart:  Regular rate and rhythm. Abdomen:  Soft, nontender and nondistended. Normal bowel sounds, without guarding, and without rebound.   Neurologic:  Alert and  oriented x4;  grossly normal neurologically.  Impression/Plan: Denise Fernandez is here for an colonoscopy to be performed for history of adenomatous polyps 12/2013  Risks, benefits, limitations, and alternatives regarding  colonoscopy have been reviewed with the patient.  Questions have been answered.  All parties agreeable.   Lucilla Lame, MD  05/31/2019, 7:51 AM

## 2019-06-01 ENCOUNTER — Encounter: Payer: Self-pay | Admitting: *Deleted

## 2019-06-02 ENCOUNTER — Encounter: Payer: Self-pay | Admitting: Gastroenterology

## 2019-06-02 ENCOUNTER — Encounter: Payer: Self-pay | Admitting: Internal Medicine

## 2019-06-02 LAB — SURGICAL PATHOLOGY

## 2019-06-24 ENCOUNTER — Telehealth: Payer: Self-pay

## 2019-06-24 DIAGNOSIS — Z122 Encounter for screening for malignant neoplasm of respiratory organs: Secondary | ICD-10-CM

## 2019-06-24 DIAGNOSIS — Z87891 Personal history of nicotine dependence: Secondary | ICD-10-CM

## 2019-06-24 NOTE — Telephone Encounter (Signed)
I contacted patient to schedule lung screening CT scan.  Patient had last screening scan on 07/10/18.  Patient is agreeable to schedule and scan appointment made for July 22 2019 at 8:00 am.  Patient is aware of where to go for scan.  She has not had COVID vaccines.  Patient is a former smoker and stopped smoking 4 to 5 years ago.  Patient does have new insurance (she now has [employer]).

## 2019-06-28 NOTE — Addendum Note (Signed)
Addended by: Lieutenant Diego on: 06/28/2019 01:38 PM   Modules accepted: Orders

## 2019-06-28 NOTE — Telephone Encounter (Signed)
Smoking history: former, quit 2014, 44 pack year

## 2019-07-20 ENCOUNTER — Other Ambulatory Visit: Payer: Self-pay | Admitting: Internal Medicine

## 2019-07-20 DIAGNOSIS — I700 Atherosclerosis of aorta: Secondary | ICD-10-CM

## 2019-07-22 ENCOUNTER — Other Ambulatory Visit: Payer: Self-pay

## 2019-07-22 ENCOUNTER — Ambulatory Visit
Admission: RE | Admit: 2019-07-22 | Discharge: 2019-07-22 | Disposition: A | Discharge status: Home / Self Care | Payer: BC Managed Care – PPO | Source: Ambulatory Visit | Attending: Oncology | Admitting: Oncology

## 2019-07-22 DIAGNOSIS — Z87891 Personal history of nicotine dependence: Secondary | ICD-10-CM | POA: Insufficient documentation

## 2019-07-22 DIAGNOSIS — Z122 Encounter for screening for malignant neoplasm of respiratory organs: Secondary | ICD-10-CM | POA: Diagnosis not present

## 2019-07-28 ENCOUNTER — Encounter: Payer: Self-pay | Admitting: *Deleted

## 2019-11-12 DIAGNOSIS — L02426 Furuncle of left lower limb: Secondary | ICD-10-CM | POA: Diagnosis not present

## 2019-11-12 DIAGNOSIS — Z08 Encounter for follow-up examination after completed treatment for malignant neoplasm: Secondary | ICD-10-CM | POA: Diagnosis not present

## 2019-11-12 DIAGNOSIS — Z85828 Personal history of other malignant neoplasm of skin: Secondary | ICD-10-CM | POA: Diagnosis not present

## 2019-11-12 DIAGNOSIS — L821 Other seborrheic keratosis: Secondary | ICD-10-CM | POA: Diagnosis not present

## 2019-11-24 ENCOUNTER — Other Ambulatory Visit: Payer: Self-pay | Admitting: Internal Medicine

## 2019-11-24 DIAGNOSIS — F411 Generalized anxiety disorder: Secondary | ICD-10-CM

## 2019-11-24 NOTE — Telephone Encounter (Signed)
Requested medication (s) are due for refill today: yes  Requested medication (s) are on the active medication list: yes  Last refill: 04/27/19   #30  5 refills  Future visit scheduled  yes 05/01/20  Notes to clinic: not delegated  Requested Prescriptions  Pending Prescriptions Disp Refills   ALPRAZolam (XANAX) 1 MG tablet [Pharmacy Med Name: ALPRAZOLAM 1MG  TABLETS] 30 tablet     Sig: TAKE 1 TABLET(1 MG) BY MOUTH TWICE DAILY AS NEEDED FOR ANXIETY      Not Delegated - Psychiatry:  Anxiolytics/Hypnotics Failed - 11/24/2019 10:21 AM      Failed - This refill cannot be delegated      Failed - Urine Drug Screen completed in last 360 days.      Failed - Valid encounter within last 6 months    Recent Outpatient Visits           7 months ago Annual physical exam   [id number] Glean Hess, MD   1 year ago Annual physical exam   [facility] Glean Hess, MD   1 year ago Acute non-recurrent maxillary sinusitis   [facility] Glean Hess, MD   2 years ago Annual physical exam   [facility] Glean Hess, MD   3 years ago Annual physical exam   [facility] [place] Glean Hess, MD       Future Appointments             In 5 months Army Melia Jesse Sans, MD [facility]

## 2020-01-18 ENCOUNTER — Encounter: Payer: Self-pay | Admitting: Internal Medicine

## 2020-01-18 ENCOUNTER — Other Ambulatory Visit: Payer: Self-pay

## 2020-01-18 ENCOUNTER — Ambulatory Visit: Payer: BC Managed Care – PPO | Admitting: Internal Medicine

## 2020-01-18 VITALS — BP 126/64 | HR 91 | Temp 98.1°F | Ht 66.0 in | Wt 179.0 lb

## 2020-01-18 DIAGNOSIS — J0100 Acute maxillary sinusitis, unspecified: Secondary | ICD-10-CM

## 2020-01-18 MED ORDER — AZITHROMYCIN 250 MG PO TABS: ORAL_TABLET | ORAL | 0 refills | Status: AC

## 2020-01-18 NOTE — Progress Notes (Signed)
Date:  01/18/2020   Name:  Denise Fernandez   DOB:  Oct 12, 1960   MRN:  [id number]   Chief Complaint: Sinusitis (Started Saturday. Moving into chest. Some yellow mucous. Zicam has helped some. Cold and flu meds OTC has helped at night. )  Sinusitis This is a new problem. The current episode started in the past 7 days. The problem has been gradually worsening since onset. There has been no fever. Associated symptoms include congestion, coughing, ear pain, headaches and sinus pressure. Pertinent negatives include no chills, hoarse voice, shortness of breath, sore throat or swollen glands. Past treatments include oral decongestants. The treatment provided mild relief.    Lab Results  Component Value Date   CREATININE 0.78 04/27/2019   BUN 13 04/27/2019   NA 140 04/27/2019   K 4.8 04/27/2019   CL 102 04/27/2019   CO2 24 04/27/2019   Lab Results  Component Value Date   CHOL 134 04/27/2019   HDL 63 04/27/2019   LDLCALC 54 04/27/2019   TRIG 90 04/27/2019   CHOLHDL 2.1 04/27/2019   Lab Results  Component Value Date   TSH 2.160 04/27/2019   No results found for: HGBA1C Lab Results  Component Value Date   WBC 4.8 04/27/2019   HGB 14.3 04/27/2019   HCT 40.9 04/27/2019   MCV 94 04/27/2019   PLT 295 04/27/2019   Lab Results  Component Value Date   ALT 27 04/27/2019   AST 19 04/27/2019   ALKPHOS 107 04/27/2019   BILITOT 0.3 04/27/2019     Review of Systems  Constitutional: Negative for chills, fatigue and fever.  HENT: Positive for congestion, ear pain and sinus pressure. Negative for hoarse voice, sore throat and trouble swallowing.   Respiratory: Positive for cough. Negative for chest tightness, shortness of breath and wheezing.   Cardiovascular: Negative for chest pain and palpitations.  Gastrointestinal: Negative for diarrhea, nausea and vomiting.  Neurological: Positive for headaches. Negative for dizziness.    Patient Active Problem List   Diagnosis Date  Noted  . Adenomatous colon polyp   . Polyp of ascending colon   . Tobacco use disorder, moderate, in sustained remission 04/23/2018  . Atherosclerosis of aorta ([place]) 04/23/2018  . Stage 2 moderate COPD by GOLD classification ([place]) 04/22/2017  . Generalized anxiety disorder 01/18/2015  . Asymptomatic bacteriuria 01/10/2015  . Fibrocystic breast disease (FCBD) 01/10/2015    No Known Allergies  Past Surgical History:  Procedure Laterality Date  . COLONOSCOPY WITH PROPOFOL N/A 05/31/2019   Procedure: COLONOSCOPY WITH BIOPSY;  Surgeon: Lucilla Lame, MD;  Location: [place];  Service: Endoscopy;  Laterality: N/A;  priority 4 wants early as possible  . NEPHROLITHOTOMY  2012   Dr. Marcelline Mates  . PARTIAL HYSTERECTOMY  2001   ovaries remain  . POLYPECTOMY N/A 05/31/2019   Procedure: POLYPECTOMY;  Surgeon: Lucilla Lame, MD;  Location: [place];  Service: Endoscopy;  Laterality: N/A;  Ascending Colon Polyp removal site closed with 2 Clips  . TONSILLECTOMY      Social History   Tobacco Use  . Smoking status: Former Smoker    Packs/day: 2.00    Years: 30.00    Pack years: 60.00    Types: Cigarettes    Quit date: 02/18/2014    Years since quitting: 5.9  . Smokeless tobacco: Never Used  Vaping Use  . Vaping Use: Never used  Substance Use Topics  . Alcohol use: Yes    Alcohol/week: 0.0 standard  drinks    Comment: occasional  . Drug use: No     Medication list has been reviewed and updated.  Current Meds  Medication Sig  . ALPRAZolam (XANAX) 1 MG tablet TAKE 1 TABLET(1 MG) BY MOUTH TWICE DAILY AS NEEDED FOR ANXIETY  . atorvastatin (LIPITOR) 10 MG tablet TAKE 1 TABLET(10 MG) BY MOUTH DAILY  . Boswellia-Glucosamine-Vit D (OSTEO BI-FLEX ONE PER DAY PO) Take by mouth daily.  . Multiple Vitamin (MULTIVITAMIN) tablet Take 1 tablet by mouth daily.  Marland Kitchen nystatin cream (MYCOSTATIN) Apply 1 application topically 2 (two) times daily.  Marland Kitchen PROAIR HFA 108 (90 Base)  MCG/ACT inhaler INL 2 INHALATIONS ITL Q 6 H PRF WHZ  . SPIRIVA HANDIHALER 18 MCG inhalation capsule   . vitamin E 1000 UNIT capsule Take 1,000 Units by mouth daily.    PHQ 2/9 Scores 01/18/2020 04/27/2019 04/23/2018 03/02/2018  PHQ - 2 Score 4 1 0 0  PHQ- 9 Score 8 3 - -    GAD 7 : Generalized Anxiety Score 01/18/2020 12/20/2015  Nervous, Anxious, on Edge 2 3  Control/stop worrying 2 3  Worry too much - different things 2 3  Trouble relaxing 2 3  Restless 2 3  Easily annoyed or irritable 2 3  Afraid - awful might happen 0 3  Total GAD 7 Score 12 21  Anxiety Difficulty Somewhat difficult Very difficult    BP Readings from Last 3 Encounters:  01/18/20 126/64  05/31/19 120/60  04/27/19 120/78    Physical Exam Constitutional:      Appearance: She is well-developed.  HENT:     Right Ear: Ear canal and external ear normal. Tympanic membrane is retracted. Tympanic membrane is not erythematous.     Left Ear: Ear canal and external ear normal. Tympanic membrane is retracted. Tympanic membrane is not erythematous.     Nose:     Right Sinus: Maxillary sinus tenderness and frontal sinus tenderness present.     Left Sinus: Maxillary sinus tenderness and frontal sinus tenderness present.  Cardiovascular:     Rate and Rhythm: Normal rate and regular rhythm.     Heart sounds: Normal heart sounds.  Pulmonary:     Effort: Pulmonary effort is normal.     Breath sounds: Normal breath sounds and air entry. No wheezing or rales.  Lymphadenopathy:     Cervical: No cervical adenopathy.  Neurological:     Mental Status: She is alert and oriented to person, place, and time.  Psychiatric:        Attention and Perception: Attention normal.        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     Wt Readings from Last 3 Encounters:  01/18/20 179 lb (81.2 kg)  07/22/19 171 lb (77.6 kg)  05/31/19 173 lb (78.5 kg)    BP 126/64   Pulse 91    Temp 98.1 F (36.7 C) (Oral)   Ht 5\' 6"  (1.676 m)   Wt 179 lb (81.2 kg)   SpO2 97%   BMI 28.89 kg/m   Assessment and Plan: 1. Acute non-recurrent maxillary sinusitis Continue over the counter cold and sinus medication - azithromycin (ZITHROMAX Z-PAK) 250 MG tablet; UAD  Dispense: 6 each; Refill: 0  Return if needed. Partially dictated using Editor, commissioning. Any errors are unintentional.  Halina Maidens, MD [facility]  01/18/2020

## 2020-02-01 ENCOUNTER — Other Ambulatory Visit: Payer: Self-pay | Admitting: Internal Medicine

## 2020-02-01 DIAGNOSIS — Z1231 Encounter for screening mammogram for malignant neoplasm of breast: Secondary | ICD-10-CM

## 2020-03-03 ENCOUNTER — Other Ambulatory Visit: Payer: Self-pay

## 2020-03-03 ENCOUNTER — Ambulatory Visit
Admission: RE | Admit: 2020-03-03 | Discharge: 2020-03-03 | Disposition: A | Discharge status: Home / Self Care | Payer: 59 | Source: Ambulatory Visit | Attending: Internal Medicine | Admitting: Internal Medicine

## 2020-03-03 DIAGNOSIS — Z1231 Encounter for screening mammogram for malignant neoplasm of breast: Secondary | ICD-10-CM | POA: Insufficient documentation

## 2020-04-15 ENCOUNTER — Other Ambulatory Visit: Payer: Self-pay | Admitting: Internal Medicine

## 2020-04-15 DIAGNOSIS — I700 Atherosclerosis of aorta: Secondary | ICD-10-CM

## 2020-04-30 ENCOUNTER — Encounter: Payer: Self-pay | Admitting: Internal Medicine

## 2020-04-30 DIAGNOSIS — Z90710 Acquired absence of both cervix and uterus: Secondary | ICD-10-CM | POA: Insufficient documentation

## 2020-05-01 ENCOUNTER — Other Ambulatory Visit: Payer: Self-pay

## 2020-05-01 ENCOUNTER — Ambulatory Visit (INDEPENDENT_AMBULATORY_CARE_PROVIDER_SITE_OTHER): Payer: 59 | Admitting: Internal Medicine

## 2020-05-01 ENCOUNTER — Encounter: Payer: Self-pay | Admitting: Internal Medicine

## 2020-05-01 VITALS — BP 110/72 | HR 102 | Temp 97.9°F | Ht 66.0 in | Wt 166.0 lb

## 2020-05-01 DIAGNOSIS — K529 Noninfective gastroenteritis and colitis, unspecified: Secondary | ICD-10-CM

## 2020-05-01 DIAGNOSIS — I700 Atherosclerosis of aorta: Secondary | ICD-10-CM | POA: Diagnosis not present

## 2020-05-01 DIAGNOSIS — J449 Chronic obstructive pulmonary disease, unspecified: Secondary | ICD-10-CM | POA: Diagnosis not present

## 2020-05-01 DIAGNOSIS — F411 Generalized anxiety disorder: Secondary | ICD-10-CM

## 2020-05-01 DIAGNOSIS — B354 Tinea corporis: Secondary | ICD-10-CM | POA: Diagnosis not present

## 2020-05-01 DIAGNOSIS — Z0000 Encounter for general adult medical examination without abnormal findings: Secondary | ICD-10-CM

## 2020-05-01 MED ORDER — ALPRAZOLAM 1 MG PO TABS: ORAL_TABLET | ORAL | 5 refills | Status: DC

## 2020-05-01 MED ORDER — ONDANSETRON HCL 4 MG PO TABS: 4.0000 mg | ORAL_TABLET | Freq: Three times a day (TID) | ORAL | 0 refills | Status: DC | PRN

## 2020-05-01 MED ORDER — NYSTATIN 100000 UNIT/GM EX CREA: 1.0000 "application " | TOPICAL_CREAM | Freq: Two times a day (BID) | CUTANEOUS | 1 refills | Status: DC

## 2020-05-01 MED ORDER — MELOXICAM 15 MG PO TABS: 15.0000 mg | ORAL_TABLET | Freq: Every day | ORAL | 1 refills | Status: DC | PRN

## 2020-05-01 NOTE — Patient Instructions (Signed)
Imodium 1-2 times per day as needed for diarrhea  Push fluids

## 2020-05-01 NOTE — Progress Notes (Signed)
Date:  05/01/2020   Name:  Denise Fernandez   DOB:  [birth year]   MRN:  [id number]   Chief Complaint: Annual Exam (Breast exam no pap) and stomach bug (Pt was told that at this visit if she discusses anything else that she would get a separate bill. Pt verbalized understanding. And is ok with being seen for another problem )  Denise Fernandez is a 60 y.o. female who presents today for her Complete Annual Exam. She feels fairly well. She reports exercising walking 3 days a week. She reports she is sleeping well. Breast complaints none.  Mammogram: 02/2020 DEXA: none Pap smear: discontinued Colonoscopy: 05/2019 repeat 3 yrs  Immunization History  Administered Date(s) Administered  . Influenza Inj Mdck Quad Pf 11/28/2017  . Influenza,inj,Quad PF,6+ Mos 11/21/2016  . Influenza-Unspecified 11/19/2014, 11/21/2016, 11/28/2017, 11/19/2019  . Pneumococcal Polysaccharide-23 04/22/2017    Hyperlipidemia This is a chronic problem. The problem is controlled. Pertinent negatives include no chest pain or shortness of breath. Current antihyperlipidemic treatment includes statins. The current treatment provides significant improvement of lipids.  Anxiety Presents for follow-up visit. Symptoms include nausea and nervous/anxious behavior. Patient reports no chest pain, dizziness, palpitations or shortness of breath. Symptoms occur most days.   Compliance with medications: uses xanax daily.  Diarrhea  This is a new problem. The current episode started yesterday. The problem occurs 2 to 4 times per day. The problem has been unchanged. Associated symptoms include arthralgias and vomiting. Pertinent negatives include no abdominal pain, chills, coughing, fever or headaches. Nothing aggravates the symptoms. She has tried increased fluids and anti-motility drug (and fasting) for the symptoms. The treatment provided moderate (feels better today) relief.  COPD - quit smoking 2016.  On Spiriva and Proair.   Participating in lung cancer screening.  Last done 07/2019  Lab Results  Component Value Date   CREATININE 0.78 04/27/2019   BUN 13 04/27/2019   NA 140 04/27/2019   K 4.8 04/27/2019   CL 102 04/27/2019   CO2 24 04/27/2019   Lab Results  Component Value Date   CHOL 134 04/27/2019   HDL 63 04/27/2019   LDLCALC 54 04/27/2019   TRIG 90 04/27/2019   CHOLHDL 2.1 04/27/2019   Lab Results  Component Value Date   TSH 2.160 04/27/2019   No results found for: HGBA1C Lab Results  Component Value Date   WBC 4.8 04/27/2019   HGB 14.3 04/27/2019   HCT 40.9 04/27/2019   MCV 94 04/27/2019   PLT 295 04/27/2019   Lab Results  Component Value Date   ALT 27 04/27/2019   AST 19 04/27/2019   ALKPHOS 107 04/27/2019   BILITOT 0.3 04/27/2019     Review of Systems  Constitutional: Negative for chills, fatigue and fever.  HENT: Negative for congestion, hearing loss, tinnitus, trouble swallowing and voice change.   Eyes: Negative for visual disturbance.  Respiratory: Negative for cough, chest tightness, shortness of breath and wheezing.   Cardiovascular: Negative for chest pain, palpitations and leg swelling.  Gastrointestinal: Positive for diarrhea, nausea and vomiting. Negative for abdominal pain and constipation.  Endocrine: Negative for polydipsia and polyuria.  Genitourinary: Negative for dysuria, frequency, genital sores, vaginal bleeding and vaginal discharge.  Musculoskeletal: Positive for arthralgias. Negative for gait problem and joint swelling.  Skin: Negative for color change and rash.  Neurological: Negative for dizziness, tremors, light-headedness and headaches.  Hematological: Negative for adenopathy. Does not bruise/bleed easily.  Psychiatric/Behavioral: Negative for dysphoric mood and sleep  disturbance. The patient is nervous/anxious.     Patient Active Problem List   Diagnosis Date Noted  . S/P total hysterectomy 04/30/2020  . Adenomatous colon polyp   . Polyp of  ascending colon   . Tobacco use disorder, moderate, in sustained remission 04/23/2018  . Atherosclerosis of aorta ([place]) 04/23/2018  . Stage 2 moderate COPD by GOLD classification ([place]) 04/22/2017  . Generalized anxiety disorder 01/18/2015  . Asymptomatic bacteriuria 01/10/2015  . Fibrocystic breast disease (FCBD) 01/10/2015    No Known Allergies  Past Surgical History:  Procedure Laterality Date  . COLONOSCOPY WITH PROPOFOL N/A 05/31/2019   Procedure: COLONOSCOPY WITH BIOPSY;  Surgeon: Lucilla Lame, MD;  Location: [place];  Service: Endoscopy;  Laterality: N/A;  priority 4 wants early as possible  . NEPHROLITHOTOMY  2012   Dr. Marcelline Mates  . PARTIAL HYSTERECTOMY  2001   ovaries remain  . POLYPECTOMY N/A 05/31/2019   Procedure: POLYPECTOMY;  Surgeon: Lucilla Lame, MD;  Location: [place];  Service: Endoscopy;  Laterality: N/A;  Ascending Colon Polyp removal site closed with 2 Clips  . TONSILLECTOMY      Social History   Tobacco Use  . Smoking status: Former Smoker    Packs/day: 2.00    Years: 30.00    Pack years: 60.00    Types: Cigarettes    Quit date: 02/18/2014    Years since quitting: 6.2  . Smokeless tobacco: Never Used  Vaping Use  . Vaping Use: Never used  Substance Use Topics  . Alcohol use: Yes    Alcohol/week: 0.0 standard drinks    Comment: occasional  . Drug use: No     Medication list has been reviewed and updated.  Current Meds  Medication Sig  . ALPRAZolam (XANAX) 1 MG tablet TAKE 1 TABLET(1 MG) BY MOUTH TWICE DAILY AS NEEDED FOR ANXIETY  . atorvastatin (LIPITOR) 10 MG tablet TAKE 1 TABLET(10 MG) BY MOUTH DAILY  . [place]-Glucosamine-Vit D (OSTEO BI-FLEX ONE PER DAY PO) Take by mouth daily.  . Multiple Vitamin (MULTIVITAMIN) tablet Take 1 tablet by mouth daily.  Marland Kitchen nystatin cream (MYCOSTATIN) Apply 1 application topically 2 (two) times daily.  Marland Kitchen PROAIR HFA 108 (90 Base) MCG/ACT inhaler INL 2 INHALATIONS ITL Q 6 H PRF WHZ   . SPIRIVA HANDIHALER 18 MCG inhalation capsule   . vitamin E 1000 UNIT capsule Take 1,000 Units by mouth daily.    PHQ 2/9 Scores 05/01/2020 01/18/2020 04/27/2019 04/23/2018  PHQ - 2 Score 0 4 1 0  PHQ- 9 Score 0 8 3 -    GAD 7 : Generalized Anxiety Score 05/01/2020 01/18/2020 12/20/2015  Nervous, Anxious, on Edge 1 2 3   Control/stop worrying 1 2 3   Worry too much - different things 1 2 3   Trouble relaxing 0 2 3  Restless 0 2 3  Easily annoyed or irritable 0 2 3  Afraid - awful might happen 0 0 3  Total GAD 7 Score 3 12 21   Anxiety Difficulty - Somewhat difficult Very difficult    BP Readings from Last 3 Encounters:  05/01/20 110/72  01/18/20 126/64  05/31/19 120/60    Physical Exam Vitals and nursing note reviewed.  Constitutional:      General: She is not in acute distress.    Appearance: She is well-developed.  HENT:     Head: Normocephalic and atraumatic.     Right Ear: Tympanic membrane and ear canal normal.     Left Ear: Tympanic membrane  and ear canal normal.     Nose:     Right Sinus: No maxillary sinus tenderness.     Left Sinus: No maxillary sinus tenderness.  Eyes:     General: No scleral icterus.       Right eye: No discharge.        Left eye: No discharge.     Conjunctiva/sclera: Conjunctivae normal.  Neck:     Thyroid: No thyromegaly.     Vascular: No carotid bruit.  Cardiovascular:     Rate and Rhythm: Normal rate and regular rhythm.     Pulses: Normal pulses.     Heart sounds: Normal heart sounds.  Pulmonary:     Effort: Pulmonary effort is normal. No respiratory distress.     Breath sounds: No wheezing.  Chest:  Breasts:     Right: No mass, nipple discharge, skin change or tenderness.     Left: No mass, nipple discharge, skin change or tenderness.    Abdominal:     General: Bowel sounds are normal. There is no distension.     Palpations: Abdomen is soft.     Tenderness: There is no abdominal tenderness. There is no guarding or rebound.   Musculoskeletal:        General: Normal range of motion.     Cervical back: Normal range of motion. No erythema.     Right lower leg: No edema.     Left lower leg: No edema.  Lymphadenopathy:     Cervical: No cervical adenopathy.  Skin:    General: Skin is warm and dry.     Findings: No rash.  Neurological:     Mental Status: She is alert and oriented to person, place, and time.     Cranial Nerves: No cranial nerve deficit.     Sensory: No sensory deficit.     Deep Tendon Reflexes: Reflexes are normal and symmetric.  Psychiatric:        Attention and Perception: Attention normal.        Mood and Affect: Mood normal.     Wt Readings from Last 3 Encounters:  05/01/20 166 lb (75.3 kg)  01/18/20 179 lb (81.2 kg)  07/22/19 171 lb (77.6 kg)    BP 110/72   Pulse (!) 102   Ht 5\' 6"  (1.676 m)   Wt 166 lb (75.3 kg)   SpO2 97%   BMI 26.79 kg/m   Assessment and Plan: 1. Annual physical exam Normal exam Continue healthy diet  2. Atherosclerosis of aorta (H[facility]) Continue statin medication and tobacco abstinence - Comprehensive metabolic panel - Lipid panel  3. Stage 2 moderate COPD by GOLD classification ([place]) Stable Continue yearly lung cancer screening by CT - CBC with Differential/Platelet  4. Generalized anxiety disorder Doing well, no SI/HI - ALPRAZolam (XANAX) 1 MG tablet; TAKE 1 TABLET(1 MG) BY MOUTH TWICE DAILY AS NEEDED FOR ANXIETY  Dispense: 30 tablet; Refill: 5  5. Tinea corporis - nystatin cream (MYCOSTATIN); Apply 1 application topically 2 (two) times daily.  Dispense: 30 g; Refill: 1  6. Gastroenteritis Continue to push fluids; imodium 1-2 times per day if needed Use Zofran for nausea Call if no improvement - ondansetron (ZOFRAN) 4 MG tablet; Take 1 tablet (4 mg total) by mouth every 8 (eight) hours as needed for nausea or vomiting.  Dispense: 20 tablet; Refill: 0   Partially dictated using Editor, commissioning. Any errors are unintentional.  Halina Maidens, MD [facility]  05/01/2020    

## 2020-05-02 LAB — CBC WITH DIFFERENTIAL/PLATELET
Basophils Absolute: 0 10*3/uL (ref 0.0–0.2)
Basos: 1 %
EOS (ABSOLUTE): 0 10*3/uL (ref 0.0–0.4)
Eos: 0 %
Hematocrit: 43.1 % (ref 34.0–46.6)
Hemoglobin: 15.2 g/dL (ref 11.1–15.9)
Immature Grans (Abs): 0 10*3/uL (ref 0.0–0.1)
Immature Granulocytes: 0 %
Lymphocytes Absolute: 0.8 10*3/uL (ref 0.7–3.1)
Lymphs: 15 %
MCH: 32.8 pg (ref 26.6–33.0)
MCHC: 35.3 g/dL (ref 31.5–35.7)
MCV: 93 fL (ref 79–97)
Monocytes Absolute: 0.4 10*3/uL (ref 0.1–0.9)
Monocytes: 7 %
Neutrophils Absolute: 4.3 10*3/uL (ref 1.4–7.0)
Neutrophils: 77 %
Platelets: 265 10*3/uL (ref 150–450)
RBC: 4.64 x10E6/uL (ref 3.77–5.28)
RDW: 11.4 % — ABNORMAL LOW (ref 11.7–15.4)
WBC: 5.6 10*3/uL (ref 3.4–10.8)

## 2020-05-02 LAB — LIPID PANEL
Chol/HDL Ratio: 2.2 ratio (ref 0.0–4.4)
Cholesterol, Total: 132 mg/dL (ref 100–199)
HDL: 61 mg/dL (ref >39)
LDL Chol Calc (NIH): 52 mg/dL (ref 0–99)
Triglycerides: 104 mg/dL (ref 0–149)
VLDL Cholesterol Cal: 19 mg/dL (ref 5–40)

## 2020-05-02 LAB — COMPREHENSIVE METABOLIC PANEL
ALT: 21 IU/L (ref 0–32)
AST: 14 IU/L (ref 0–40)
Albumin/Globulin Ratio: 1.9 (ref 1.2–2.2)
Albumin: 4.6 g/dL (ref 3.8–4.9)
Alkaline Phosphatase: 94 IU/L (ref 44–121)
BUN/Creatinine Ratio: 21 (ref 9–23)
BUN: 19 mg/dL (ref 6–24)
Bilirubin Total: 0.3 mg/dL (ref 0.0–1.2)
CO2: 23 mmol/L (ref 20–29)
Calcium: 9.2 mg/dL (ref 8.7–10.2)
Chloride: 104 mmol/L (ref 96–106)
Creatinine, Ser: 0.92 mg/dL (ref 0.57–1.00)
Globulin, Total: 2.4 g/dL (ref 1.5–4.5)
Glucose: 98 mg/dL (ref 65–99)
Potassium: 4.5 mmol/L (ref 3.5–5.2)
Sodium: 143 mmol/L (ref 134–144)
Total Protein: 7 g/dL (ref 6.0–8.5)
eGFR: 72 mL/min/{1.73_m2} (ref >59)

## 2020-07-12 ENCOUNTER — Other Ambulatory Visit: Payer: Self-pay | Admitting: Internal Medicine

## 2020-07-12 DIAGNOSIS — I700 Atherosclerosis of aorta: Secondary | ICD-10-CM

## 2020-07-12 NOTE — Telephone Encounter (Signed)
Requested Prescriptions  Pending Prescriptions Disp Refills  . atorvastatin (LIPITOR) 10 MG tablet [Pharmacy Med Name: ATORVASTATIN 10MG  TABLETS] 90 tablet 3    Sig: TAKE 1 TABLET(10 MG) BY MOUTH DAILY     Cardiovascular:  Antilipid - Statins Passed - 07/12/2020  3:36 AM      Passed - Total Cholesterol in normal range and within 360 days    Cholesterol, Total  Date Value Ref Range Status  05/01/2020 132 100 - 199 mg/dL Final         Passed - LDL in normal range and within 360 days    LDL Chol Calc (NIH)  Date Value Ref Range Status  05/01/2020 52 0 - 99 mg/dL Final         Passed - HDL in normal range and within 360 days    HDL  Date Value Ref Range Status  05/01/2020 61 >39 mg/dL Final         Passed - Triglycerides in normal range and within 360 days    Triglycerides  Date Value Ref Range Status  05/01/2020 104 0 - 149 mg/dL Final         Passed - Patient is not pregnant      Passed - Valid encounter within last 12 months    Recent Outpatient Visits          2 months ago Annual physical exam   [facility] Glean Hess, MD   5 months ago Acute non-recurrent maxillary sinusitis   [facility] Glean Hess, MD   1 year ago Annual physical exam   [facility] Glean Hess, [facility]   2 years ago Annual physical exam   [facility] Glean Hess, MD   2 years ago Acute non-recurrent maxillary sinusitis   [facility] Glean Hess, MD      Future Appointments            In 9 months Army Melia Jesse Sans, MD [facility], [facility]

## 2020-07-14 ENCOUNTER — Other Ambulatory Visit: Payer: Self-pay | Admitting: Internal Medicine

## 2020-07-26 ENCOUNTER — Other Ambulatory Visit: Payer: Self-pay | Admitting: *Deleted

## 2020-07-26 ENCOUNTER — Telehealth: Payer: Self-pay

## 2020-07-26 DIAGNOSIS — Z122 Encounter for screening for malignant neoplasm of respiratory organs: Secondary | ICD-10-CM

## 2020-07-26 DIAGNOSIS — Z87891 Personal history of nicotine dependence: Secondary | ICD-10-CM

## 2020-07-26 NOTE — Progress Notes (Signed)
Contacted and scheduled for annual lung screening scan. Patient is a former smoker, quit 2016, 60 pack year history.

## 2020-07-26 NOTE — Telephone Encounter (Signed)
Left message for patient to notify them that it is time to schedule annual low dose lung cancer screening CT scan. Instructed patient to call back ([phone]) to verify information and schedule.  

## 2020-08-10 ENCOUNTER — Other Ambulatory Visit: Payer: Self-pay

## 2020-08-10 ENCOUNTER — Ambulatory Visit
Admission: RE | Admit: 2020-08-10 | Discharge: 2020-08-10 | Disposition: A | Discharge status: Home / Self Care | Payer: 59 | Source: Ambulatory Visit | Attending: Nurse Practitioner | Admitting: Nurse Practitioner

## 2020-08-10 DIAGNOSIS — Z87891 Personal history of nicotine dependence: Secondary | ICD-10-CM | POA: Insufficient documentation

## 2020-08-10 DIAGNOSIS — Z122 Encounter for screening for malignant neoplasm of respiratory organs: Secondary | ICD-10-CM | POA: Diagnosis present

## 2020-08-31 ENCOUNTER — Encounter: Payer: Self-pay | Admitting: *Deleted

## 2020-11-20 IMAGING — MG DIGITAL SCREENING BILATERAL MAMMOGRAM WITH TOMO AND CAD
8 series · 9 of 24 positions shown · non-contrast
Comparison: Previous exam(s).

CLINICAL DATA: Screening.

EXAM:
DIGITAL SCREENING BILATERAL MAMMOGRAM WITH TOMO AND CAD

[R CC synth-2D]
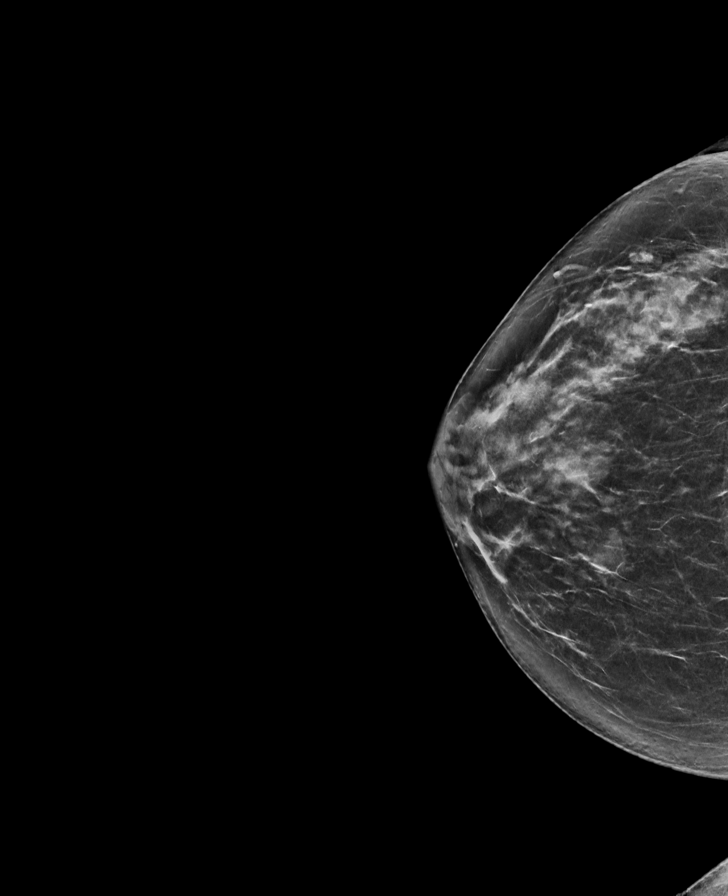

[R MLO synth-2D]
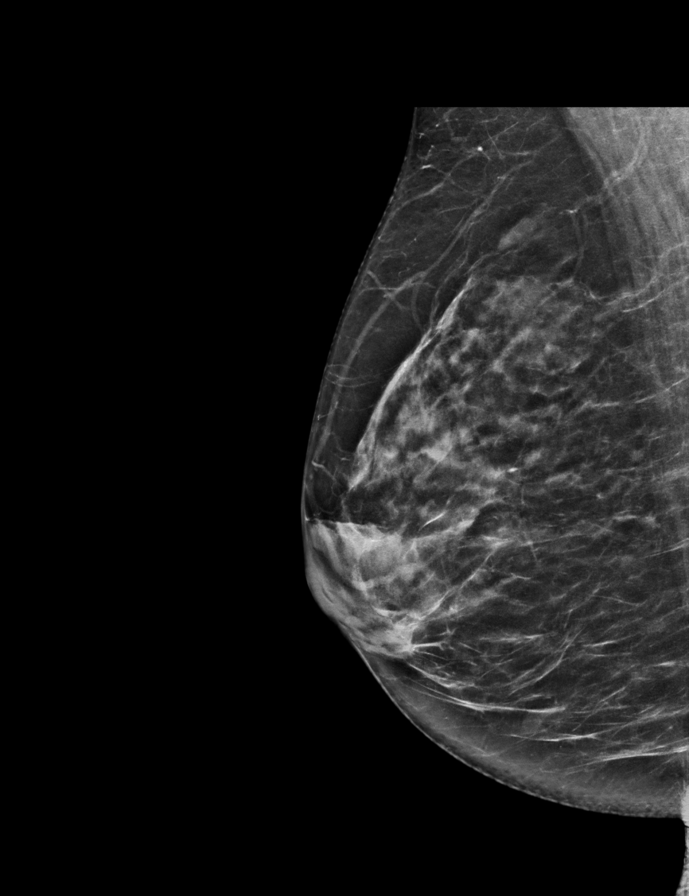

[L CC synth-2D]
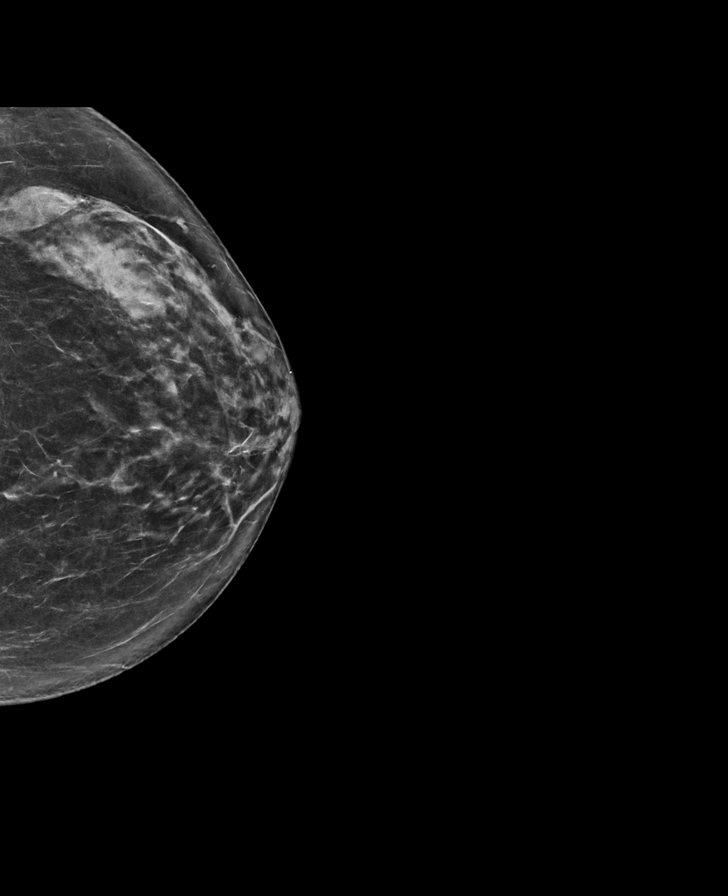

[L MLO synth-2D]
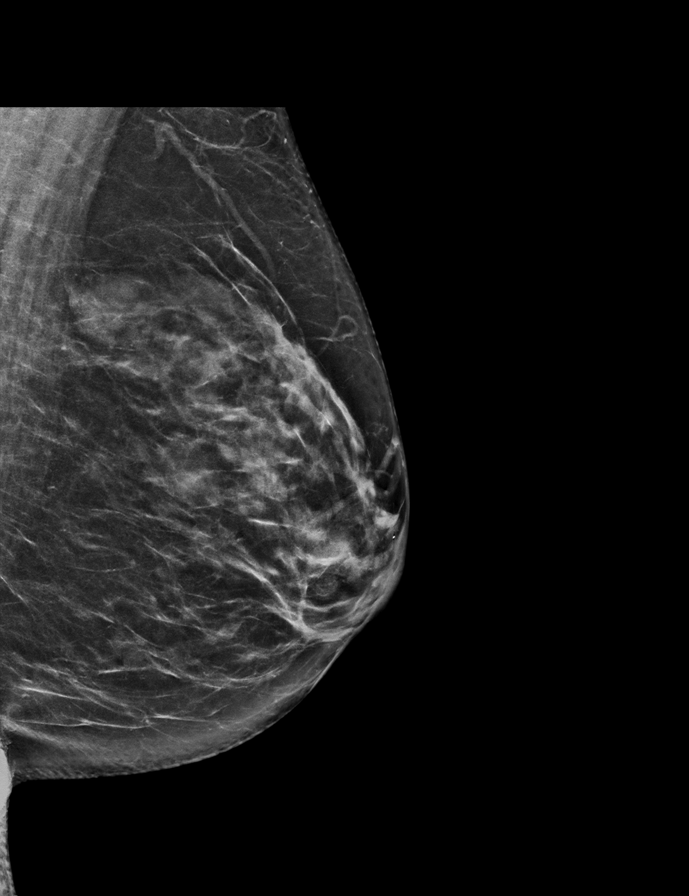

[L CC tomo · 2 of 70 frames shown]
[frame 23/70]
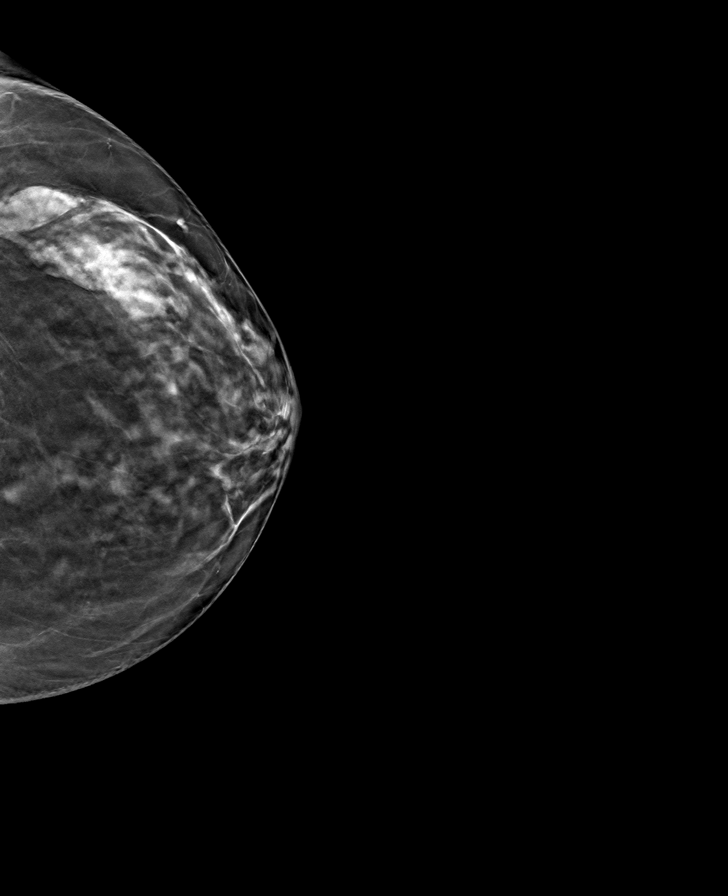
[frame 35/70]
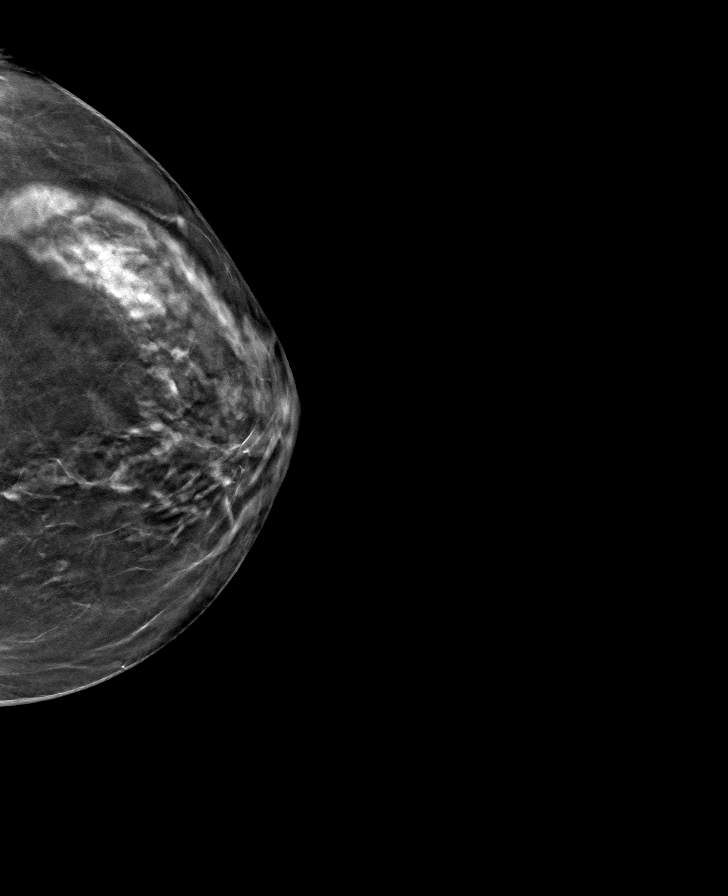

[R MLO tomo · tomo slice 34/67.0]
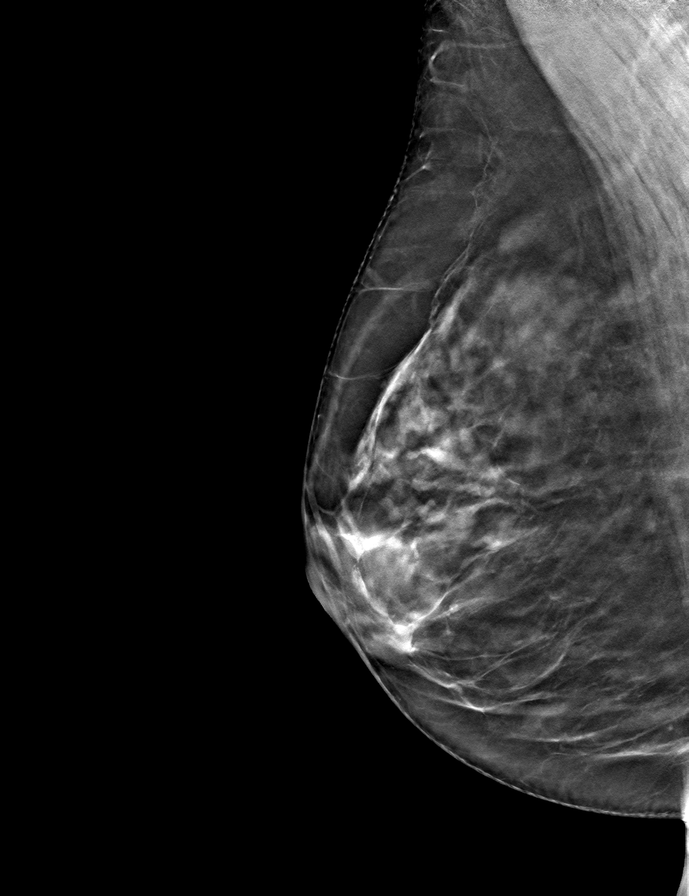

[R CC tomo · tomo slice 35/70.0]
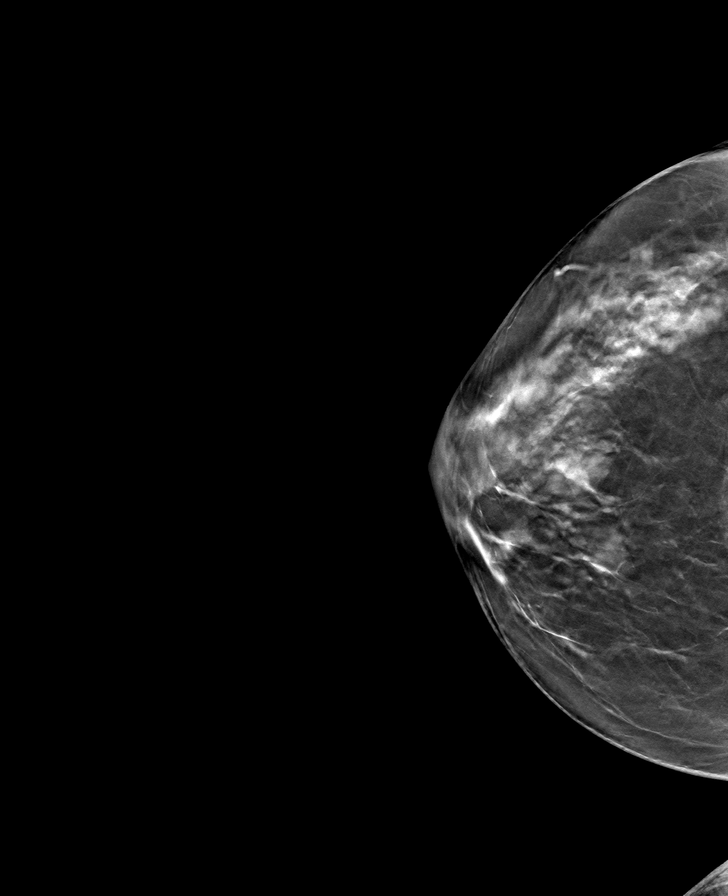

[L MLO tomo · tomo slice 35/70.0]
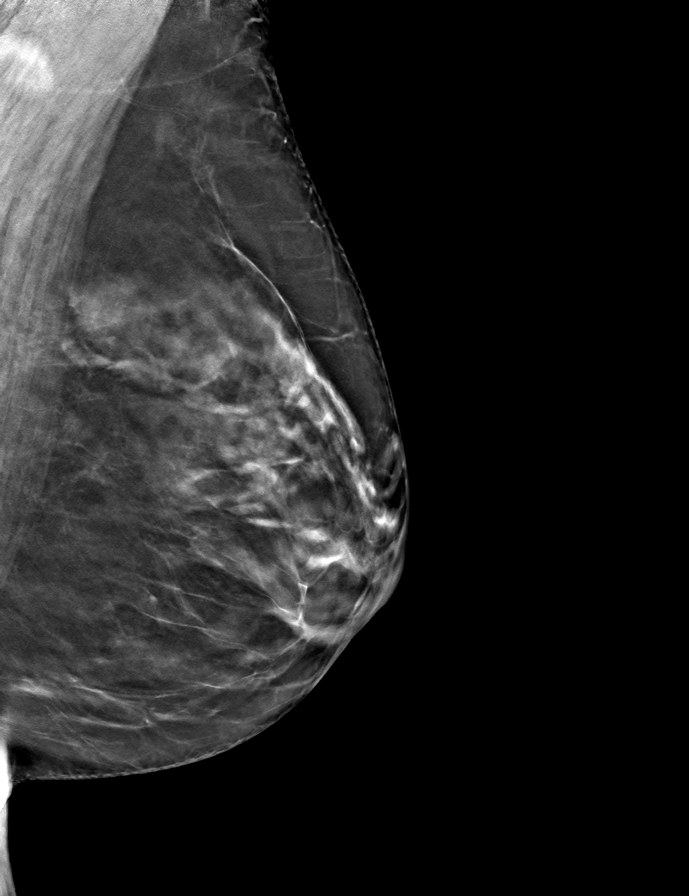

[9 of 24 positions shown; findings below may reference images not displayed]

ACR Breast Density Category c: The breast tissue is heterogeneously
dense, which may obscure small masses.
FINDINGS: There are no findings suspicious for malignancy. Images were
processed with CAD.
IMPRESSION: No mammographic evidence of malignancy. A result letter of this
screening mammogram will be mailed directly to the patient.

RECOMMENDATION:
Screening mammogram in one year. (Code:[id number])

BI-RADS CATEGORY  1: Negative.

## 2020-12-06 ENCOUNTER — Telehealth: Payer: Self-pay

## 2020-12-06 NOTE — Telephone Encounter (Signed)
Spoke with Denise Fernandez who orders CT LUNG SCREENING tests and she said she has contacted patient. The billing department has rebilled her clam and it can take up to 30 days to finish processing.

## 2020-12-06 NOTE — Telephone Encounter (Signed)
Called and spoke with patient. She said she received a bill from radiology for CT LUNG SCREEN test for $175.84. Told her I can try to find out what needs to be done for coding and call her back.

## 2020-12-06 NOTE — Telephone Encounter (Signed)
Copied from [place] [id number]. Topic: General - Other >> Dec 06, 2020  9:26 AM Denise Fernandez A wrote: Reason for CRM: The patient would like to be contacted by a member of staff about a form needed for their lung screening cost to be covered  Please contact further when available

## 2020-12-19 ENCOUNTER — Other Ambulatory Visit: Payer: Self-pay | Admitting: Internal Medicine

## 2020-12-19 DIAGNOSIS — F411 Generalized anxiety disorder: Secondary | ICD-10-CM

## 2020-12-19 NOTE — Telephone Encounter (Signed)
Requested medication (s) are due for refill today:   Provider to review  Requested medication (s) are on the active medication list:   Yes  Future visit scheduled:   Yes   Last ordered: 05/01/2020 #30, 5 refills  Non delegated refill per protocol   Requested Prescriptions  Pending Prescriptions Disp Refills   ALPRAZolam (XANAX) 1 MG tablet [Pharmacy Med Name: ALPRAZOLAM 1MG  TABLETS] 30 tablet     Sig: TAKE 1 TABLET(1 MG) BY MOUTH TWICE DAILY AS NEEDED FOR ANXIETY     Not Delegated - Psychiatry:  Anxiolytics/Hypnotics Failed - 12/19/2020  9:06 AM      Failed - This refill cannot be delegated      Failed - Urine Drug Screen completed in last 360 days      Failed - Valid encounter within last 6 months    Recent Outpatient Visits           7 months ago Annual physical exam   [place], [place]   11 months ago Acute non-recurrent maxillary sinusitis   [facility] Glean Hess, MD   1 year ago Annual physical exam   [facility] Glean Hess, MD   2 years ago Annual physical exam   [facility] Glean Hess, MD   2 years ago Acute non-recurrent maxillary sinusitis   [facility] Glean Hess, MD       Future Appointments             In 4 months Army Melia Jesse Sans, MD [facility], [facility]

## 2021-01-18 ENCOUNTER — Other Ambulatory Visit: Payer: Self-pay | Admitting: Internal Medicine

## 2021-01-18 DIAGNOSIS — Z1231 Encounter for screening mammogram for malignant neoplasm of breast: Secondary | ICD-10-CM

## 2021-02-16 ENCOUNTER — Other Ambulatory Visit: Payer: Self-pay | Admitting: Internal Medicine

## 2021-02-16 NOTE — Telephone Encounter (Signed)
Requested Prescriptions  Pending Prescriptions Disp Refills   meloxicam (MOBIC) 15 MG tablet [Pharmacy Med Name: MELOXICAM 15MG  TABLETS] 30 tablet 1    Sig: TAKE 1 TABLET(15 MG) BY MOUTH DAILY AS NEEDED     Analgesics:  COX2 Inhibitors Passed - 02/16/2021  3:35 AM      Passed - HGB in normal range and within 360 days    Hemoglobin  Date Value Ref Range Status  05/01/2020 15.2 11.1 - 15.9 g/dL Final         Passed - Cr in normal range and within 360 days    Creatinine, Ser  Date Value Ref Range Status  05/01/2020 0.92 0.57 - 1.00 mg/dL Final         Passed - Patient is not pregnant      Passed - Valid encounter within last 12 months    Recent Outpatient Visits          9 months ago Annual physical exam   [facility] Glean Hess, MD   1 year ago Acute non-recurrent maxillary sinusitis   [facility] Glean Hess, MD   1 year ago Annual physical exam   [facility] Glean Hess, MD   2 years ago Annual physical exam   [facility] Glean Hess, MD   2 years ago Acute non-recurrent maxillary sinusitis   [facility] Glean Hess, MD      Future Appointments            In 2 months Army Melia Jesse Sans, MD [facility], [facility]

## 2021-03-06 ENCOUNTER — Ambulatory Visit
Admission: RE | Admit: 2021-03-06 | Discharge: 2021-03-06 | Disposition: A | Discharge status: Home / Self Care | Payer: Commercial Managed Care - HMO | Source: Ambulatory Visit | Attending: Internal Medicine | Admitting: Internal Medicine

## 2021-03-06 ENCOUNTER — Other Ambulatory Visit: Payer: Self-pay

## 2021-03-06 DIAGNOSIS — Z1231 Encounter for screening mammogram for malignant neoplasm of breast: Secondary | ICD-10-CM | POA: Insufficient documentation

## 2021-04-22 ENCOUNTER — Other Ambulatory Visit: Payer: Self-pay | Admitting: Internal Medicine

## 2021-04-22 DIAGNOSIS — I700 Atherosclerosis of aorta: Secondary | ICD-10-CM

## 2021-04-23 NOTE — Telephone Encounter (Signed)
Requested Prescriptions  ?Pending Prescriptions Disp Refills  ?? meloxicam (MOBIC) 15 MG tablet [Pharmacy Med Name: MELOXICAM 15MG TABLETS] 30 tablet 1  ?  Sig: TAKE 1 TABLET(15 MG) BY MOUTH DAILY AS NEEDED  ?  ? Analgesics:  COX2 Inhibitors Failed - 04/22/2021  8:04 AM  ?  ?  Failed - Manual Review: Labs are only required if the patient has taken medication for more than 8 weeks.  ?  ?  Passed - HGB in normal range and within 360 days  ?  Hemoglobin  ?Date Value Ref Range Status  ?05/01/2020 15.2 11.1 - 15.9 g/dL Final  ?   ?  ?  Passed - Cr in normal range and within 360 days  ?  Creatinine, Ser  ?Date Value Ref Range Status  ?05/01/2020 0.92 0.57 - 1.00 mg/dL Final  ?   ?  ?  Passed - HCT in normal range and within 360 days  ?  Hematocrit  ?Date Value Ref Range Status  ?05/01/2020 43.1 34.0 - 46.6 % Final  ?   ?  ?  Passed - AST in normal range and within 360 days  ?  AST  ?Date Value Ref Range Status  ?05/01/2020 14 0 - 40 IU/L Final  ?   ?  ?  Passed - ALT in normal range and within 360 days  ?  ALT  ?Date Value Ref Range Status  ?05/01/2020 21 0 - 32 IU/L Final  ?   ?  ?  Passed - eGFR is 30 or above and within 360 days  ?  GFR calc Af Amer  ?Date Value Ref Range Status  ?04/27/2019 98 >59 mL/min/1.73 Final  ? ?GFR calc non Af Amer  ?Date Value Ref Range Status  ?04/27/2019 85 >59 mL/min/1.73 Final  ? ?eGFR  ?Date Value Ref Range Status  ?05/01/2020 72 >59 mL/min/1.73 Final  ?   ?  ?  Passed - Patient is not pregnant  ?  ?  Passed - Valid encounter within last 12 months  ?  Recent Outpatient Visits   ?      ? 11 months ago Annual physical exam  ? [facility] Glean Hess, MD  ? 1 year ago Acute non-recurrent maxillary sinusitis  ? [facility] Glean Hess, MD  ? 1 year ago Annual physical exam  ? [facility] Glean Hess, MD  ? 3 years ago Annual physical exam  ? [facility] Glean Hess, MD  ? 3 years ago Acute non-recurrent maxillary sinusitis  ?  [facility] Glean Hess, MD  ?  ?  ?Future Appointments   ?        ? In 1 week Glean Hess, MD [facility], [facility]  ?  ? ?  ?  ?  ?? atorvastatin (LIPITOR) 10 MG tablet [Pharmacy Med Name: ATORVASTATIN 10MG TABLETS] 90 tablet 3  ?  Sig: TAKE 1 TABLET(10 MG) BY MOUTH DAILY  ?  ? Cardiovascular:  Antilipid - Statins Failed - 04/22/2021  8:04 AM  ?  ?  Failed - Lipid Panel in normal range within the last 12 months  ?  Cholesterol, Total  ?Date Value Ref Range Status  ?05/01/2020 132 100 - 199 mg/dL Final  ? ?LDL Chol Calc (NIH)  ?Date Value Ref Range Status  ?05/01/2020 52 0 - 99 mg/dL Final  ? ?HDL  ?Date Value Ref Range Status  ?05/01/2020 61 >  39 mg/dL Final  ? ?Triglycerides  ?Date Value Ref Range Status  ?05/01/2020 104 0 - 149 mg/dL Final  ? ?  ?  ?  Passed - Patient is not pregnant  ?  ?  Passed - Valid encounter within last 12 months  ?  Recent Outpatient Visits   ?      ? 11 months ago Annual physical exam  ? [facility] Glean Hess, MD  ? 1 year ago Acute non-recurrent maxillary sinusitis  ? [facility] Glean Hess, MD  ? 1 year ago Annual physical exam  ? [facility] Glean Hess, MD  ? 3 years ago Annual physical exam  ? [facility] Glean Hess, MD  ? 3 years ago Acute non-recurrent maxillary sinusitis  ? [facility] Glean Hess, MD  ?  ?  ?Future Appointments   ?        ? In 1 week Glean Hess, MD [facility], [place]  ?  ? ?  ?  ?  ? ?

## 2021-04-24 ENCOUNTER — Other Ambulatory Visit: Payer: Self-pay | Admitting: Internal Medicine

## 2021-04-25 NOTE — Telephone Encounter (Signed)
Requested medication (s) are due for refill today:   Yes ? ?Requested medication (s) are on the active medication list:   Yes ? ?Future visit scheduled:   Yes on 05/03/2021 ? ? ?Last ordered: 04/23/2021 #30, 1 refill ? ?Returned because a 90 day supply is being requested.  ? ?Requested Prescriptions  ?Pending Prescriptions Disp Refills  ? meloxicam (MOBIC) 15 MG tablet [Pharmacy Med Name: MELOXICAM 15MG TABLETS] 90 tablet   ?  Sig: TAKE 1 TABLET(15 MG) BY MOUTH DAILY AS NEEDED  ?  ? Analgesics:  COX2 Inhibitors Failed - 04/24/2021 11:00 AM  ?  ?  Failed - Manual Review: Labs are only required if the patient has taken medication for more than 8 weeks.  ?  ?  Passed - HGB in normal range and within 360 days  ?  Hemoglobin  ?Date Value Ref Range Status  ?05/01/2020 15.2 11.1 - 15.9 g/dL Final  ?  ?  ?  ?  Passed - Cr in normal range and within 360 days  ?  Creatinine, Ser  ?Date Value Ref Range Status  ?05/01/2020 0.92 0.57 - 1.00 mg/dL Final  ?  ?  ?  ?  Passed - HCT in normal range and within 360 days  ?  Hematocrit  ?Date Value Ref Range Status  ?05/01/2020 43.1 34.0 - 46.6 % Final  ?  ?  ?  ?  Passed - AST in normal range and within 360 days  ?  AST  ?Date Value Ref Range Status  ?05/01/2020 14 0 - 40 IU/L Final  ?  ?  ?  ?  Passed - ALT in normal range and within 360 days  ?  ALT  ?Date Value Ref Range Status  ?05/01/2020 21 0 - 32 IU/L Final  ?  ?  ?  ?  Passed - eGFR is 30 or above and within 360 days  ?  GFR calc Af Amer  ?Date Value Ref Range Status  ?04/27/2019 98 >59 mL/min/1.73 Final  ? ?GFR calc non Af Amer  ?Date Value Ref Range Status  ?04/27/2019 85 >59 mL/min/1.73 Final  ? ?eGFR  ?Date Value Ref Range Status  ?05/01/2020 72 >59 mL/min/1.73 Final  ?  ?  ?  ?  Passed - Patient is not pregnant  ?  ?  Passed - Valid encounter within last 12 months  ?  Recent Outpatient Visits   ? ?      ? 11 months ago Annual physical exam  ? [facility] Glean Hess, MD  ? 1 year ago Acute non-recurrent  maxillary sinusitis  ? [facility] Glean Hess, MD  ? 1 year ago Annual physical exam  ? [facility] Glean Hess, MD  ? 3 years ago Annual physical exam  ? [facility] Glean Hess, MD  ? 3 years ago Acute non-recurrent maxillary sinusitis  ? [facility] Glean Hess, MD  ? ?  ?  ?Future Appointments   ? ?        ? In 1 week Glean Hess, MD [facility], [place]  ? ?  ? ?  ?  ?  ? ?

## 2021-05-03 ENCOUNTER — Ambulatory Visit (INDEPENDENT_AMBULATORY_CARE_PROVIDER_SITE_OTHER): Payer: Managed Care, Other (non HMO) | Admitting: Internal Medicine

## 2021-05-03 ENCOUNTER — Other Ambulatory Visit: Payer: Self-pay

## 2021-05-03 ENCOUNTER — Encounter: Payer: Self-pay | Admitting: Internal Medicine

## 2021-05-03 VITALS — BP 122/78 | HR 83 | Ht 66.5 in | Wt 172.0 lb

## 2021-05-03 DIAGNOSIS — F411 Generalized anxiety disorder: Secondary | ICD-10-CM | POA: Diagnosis not present

## 2021-05-03 DIAGNOSIS — Z23 Encounter for immunization: Secondary | ICD-10-CM | POA: Diagnosis not present

## 2021-05-03 DIAGNOSIS — J449 Chronic obstructive pulmonary disease, unspecified: Secondary | ICD-10-CM | POA: Diagnosis not present

## 2021-05-03 DIAGNOSIS — Z0000 Encounter for general adult medical examination without abnormal findings: Secondary | ICD-10-CM | POA: Diagnosis not present

## 2021-05-03 DIAGNOSIS — I700 Atherosclerosis of aorta: Secondary | ICD-10-CM | POA: Diagnosis not present

## 2021-05-03 MED ORDER — ALPRAZOLAM 1 MG PO TABS: 1.0000 mg | ORAL_TABLET | Freq: Every evening | ORAL | 3 refills | Status: DC | PRN

## 2021-05-03 NOTE — Progress Notes (Signed)
? ? ?Date:  05/03/2021  ? ?Name:  Denise Fernandez   DOB:  1960-12-08   MRN:  [id number] ? ? ?Chief Complaint: Annual Exam ?Denise Fernandez is a 61 y.o. female who presents today for her Complete Annual Exam. She feels well. She reports exercising/ walking. She reports she is sleeping well. Breast complaints - none. ? ?Mammogram: 02/2021 ?DEXA: none ?Pap smear: discontinued ?Colonoscopy: 05/2019 repeat 3 yrs ? ?Immunization History  ?Administered Date(s) Administered  ? Influenza Inj Mdck Quad Pf 11/28/2017  ? Influenza,inj,Quad PF,6+ Mos 11/21/2016  ? Influenza-Unspecified 11/19/2014, 11/21/2016, 11/28/2017, 11/19/2019, 11/27/2020  ? Pneumococcal Polysaccharide-23 04/22/2017  ? ? ?Hyperlipidemia ?This is a chronic problem. The problem is controlled. Pertinent negatives include no chest pain or shortness of breath. Current antihyperlipidemic treatment includes statins. The current treatment provides significant improvement of lipids.  ?Anxiety ?Presents for follow-up visit. Symptoms include nervous/anxious behavior. Patient reports no chest pain, dizziness, palpitations or shortness of breath. Symptoms occur most days. The severity of symptoms is mild.  ? ? ?Lab Results  ?Component Value Date  ? NA 143 05/01/2020  ? K 4.5 05/01/2020  ? CO2 23 05/01/2020  ? GLUCOSE 98 05/01/2020  ? BUN 19 05/01/2020  ? CREATININE 0.92 05/01/2020  ? CALCIUM 9.2 05/01/2020  ? EGFR 72 05/01/2020  ? GFRNONAA 85 04/27/2019  ? ?Lab Results  ?Component Value Date  ? CHOL 132 05/01/2020  ? HDL 61 05/01/2020  ? [place] 52 05/01/2020  ? TRIG 104 05/01/2020  ? CHOLHDL 2.2 05/01/2020  ? ?Lab Results  ?Component Value Date  ? TSH 2.160 04/27/2019  ? ?No results found for: HGBA1C ?Lab Results  ?Component Value Date  ? WBC 5.6 05/01/2020  ? HGB 15.2 05/01/2020  ? HCT 43.1 05/01/2020  ? MCV 93 05/01/2020  ? PLT 265 05/01/2020  ? ?Lab Results  ?Component Value Date  ? ALT 21 05/01/2020  ? AST 14 05/01/2020  ? ALKPHOS 94 05/01/2020  ? BILITOT 0.3  05/01/2020  ? ?No results found for: 25OHVITD2, [place], VD25OH  ? ?Review of Systems  ?Constitutional:  Negative for chills, fatigue and fever.  ?HENT:  Negative for congestion, hearing loss, tinnitus, trouble swallowing and voice change.   ?Eyes:  Negative for visual disturbance.  ?Respiratory:  Negative for cough, chest tightness, shortness of breath and wheezing.   ?Cardiovascular:  Negative for chest pain, palpitations and leg swelling.  ?Gastrointestinal:  Negative for abdominal pain, constipation, diarrhea and vomiting.  ?Endocrine: Negative for polydipsia and polyuria.  ?Genitourinary:  Negative for dysuria, frequency, genital sores, vaginal bleeding and vaginal discharge.  ?Musculoskeletal:  Negative for arthralgias, gait problem and joint swelling.  ?Skin:  Negative for color change and rash.  ?Neurological:  Negative for dizziness, tremors, light-headedness and headaches.  ?Hematological:  Negative for adenopathy. Does not bruise/bleed easily.  ?Psychiatric/Behavioral:  Negative for dysphoric mood and sleep disturbance. The patient is nervous/anxious.   ? ?Patient Active Problem List  ? Diagnosis Date Noted  ? S/P total hysterectomy 04/30/2020  ? Adenomatous colon polyp   ? Polyp of ascending colon   ? Tobacco use disorder, moderate, in sustained remission 04/23/2018  ? Atherosclerosis of aorta ([place]) 04/23/2018  ? Stage 2 moderate COPD by GOLD classification ([place]) 04/22/2017  ? Generalized anxiety disorder 01/18/2015  ? Asymptomatic bacteriuria 01/10/2015  ? Fibrocystic breast disease ([facility]) 01/10/2015  ? ? ?No Known Allergies ? ?Past Surgical History:  ?Procedure Laterality Date  ? COLONOSCOPY WITH PROPOFOL N/A 05/31/2019  ? Procedure: COLONOSCOPY  WITH BIOPSY;  Surgeon: Lucilla Lame, MD;  Location: [place];  Service: Endoscopy;  Laterality: N/A;  priority 4 ?wants early as possible  ? NEPHROLITHOTOMY  2012  ? Dr. Marcelline Mates  ? PARTIAL HYSTERECTOMY  2001  ? ovaries remain  ?  POLYPECTOMY N/A 05/31/2019  ? Procedure: POLYPECTOMY;  Surgeon: Lucilla Lame, MD;  Location: [place];  Service: Endoscopy;  Laterality: N/A;  Ascending Colon Polyp removal site closed with 2 Clips  ? TONSILLECTOMY    ? ? ?Social History  ? ?Tobacco Use  ? Smoking status: Former  ?  Packs/day: 2.00  ?  Years: 30.00  ?  Pack years: 60.00  ?  Types: Cigarettes  ?  Quit date: 02/18/2014  ?  Years since quitting: 7.2  ? Smokeless tobacco: Never  ?Vaping Use  ? Vaping Use: Never used  ?Substance Use Topics  ? Alcohol use: Yes  ?  Alcohol/week: 0.0 standard drinks  ?  Comment: occasional  ? Drug use: No  ? ? ? ?Medication list has been reviewed and updated. ? ?Current Meds  ?Medication Sig  ? ALPRAZolam (XANAX) 1 MG tablet TAKE 1 TABLET(1 MG) BY MOUTH TWICE DAILY AS NEEDED FOR ANXIETY  ? atorvastatin (LIPITOR) 10 MG tablet TAKE 1 TABLET(10 MG) BY MOUTH DAILY  ? [place]-Glucosamine-Vit D (OSTEO BI-FLEX ONE PER DAY PO) Take by mouth daily.  ? meloxicam (MOBIC) 15 MG tablet TAKE 1 TABLET(15 MG) BY MOUTH DAILY AS NEEDED  ? Multiple Vitamin (MULTIVITAMIN) tablet Take 1 tablet by mouth daily.  ? nystatin cream (MYCOSTATIN) Apply 1 application topically 2 (two) times daily.  ? ondansetron (ZOFRAN) 4 MG tablet Take 1 tablet (4 mg total) by mouth every 8 (eight) hours as needed for nausea or vomiting.  ? PROAIR HFA 108 (90 Base) MCG/ACT inhaler INL 2 INHALATIONS ITL Q 6 H PRF WHZ  ? SPIRIVA HANDIHALER 18 MCG inhalation capsule   ? vitamin E 1000 UNIT capsule Take 1,000 Units by mouth daily.  ? ? ?PHQ 2/9 Scores 05/03/2021 05/01/2020 01/18/2020 04/27/2019  ?PHQ - 2 Score 0 0 4 1  ?PHQ- 9 Score 0 0 8 3  ? ? ?GAD 7 : Generalized Anxiety Score 05/03/2021 05/01/2020 01/18/2020 12/20/2015  ?Nervous, Anxious, on Edge '1 1 2 3  ' ?Control/stop worrying '2 1 2 3  ' ?Worry too much - different things '1 1 2 3  ' ?Trouble relaxing 1 0 2 3  ?Restless 0 0 2 3  ?Easily annoyed or irritable 1 0 2 3  ?Afraid - awful might happen 1 0 0 3  ?Total GAD 7  Score '7 3 12 21  ' ?Anxiety Difficulty Somewhat difficult - Somewhat difficult Very difficult  ? ? ?BP Readings from Last 3 Encounters:  ?05/03/21 122/78  ?05/01/20 110/72  ?01/18/20 126/64  ? ? ?Physical Exam ?Vitals and nursing note reviewed.  ?Constitutional:   ?   General: She is not in acute distress. ?   Appearance: She is well-developed.  ?HENT:  ?   Head: Normocephalic and atraumatic.  ?   Right Ear: Tympanic membrane and ear canal normal.  ?   Left Ear: Tympanic membrane and ear canal normal.  ?   Nose:  ?   Right Sinus: No maxillary sinus tenderness.  ?   Left Sinus: No maxillary sinus tenderness.  ?Eyes:  ?   General: No scleral icterus.    ?   Right eye: No discharge.     ?   Left eye:  No discharge.  ?   Conjunctiva/sclera: Conjunctivae normal.  ?Neck:  ?   Thyroid: No thyromegaly.  ?   Vascular: No carotid bruit.  ?Cardiovascular:  ?   Rate and Rhythm: Normal rate and regular rhythm.  ?   Pulses: Normal pulses.  ?   Heart sounds: Normal heart sounds.  ?Pulmonary:  ?   Effort: Pulmonary effort is normal. No respiratory distress.  ?   Breath sounds: No wheezing.  ?Chest:  ?Breasts: ?   Right: No mass, nipple discharge, skin change or tenderness.  ?   Left: No mass, nipple discharge, skin change or tenderness.  ?Abdominal:  ?   General: Bowel sounds are normal.  ?   Palpations: Abdomen is soft.  ?   Tenderness: There is no abdominal tenderness.  ?Musculoskeletal:  ?   Cervical back: Normal range of motion. No erythema.  ?   Right lower leg: No edema.  ?   Left lower leg: No edema.  ?Lymphadenopathy:  ?   Cervical: No cervical adenopathy.  ?Skin: ?   General: Skin is warm and dry.  ?   Capillary Refill: Capillary refill takes less than 2 seconds.  ?   Findings: No rash.  ?Neurological:  ?   General: No focal deficit present.  ?   Mental Status: She is alert and oriented to person, place, and time.  ?   Cranial Nerves: No cranial nerve deficit.  ?   Sensory: No sensory deficit.  ?   Deep Tendon Reflexes:  Reflexes are normal and symmetric.  ?Psychiatric:     ?   Attention and Perception: Attention normal.     ?   Mood and Affect: Mood normal.  ? ? ?Wt Readings from Last 3 Encounters:  ?05/03/21 172 lb (78 kg)  ?06/23/2

## 2021-05-04 LAB — LIPID PANEL
Chol/HDL Ratio: 2.2 ratio (ref 0.0–4.4)
Cholesterol, Total: 140 mg/dL (ref 100–199)
HDL: 65 mg/dL (ref >39)
LDL Chol Calc (NIH): 55 mg/dL (ref 0–99)
Triglycerides: 110 mg/dL (ref 0–149)
VLDL Cholesterol Cal: 20 mg/dL (ref 5–40)

## 2021-05-04 LAB — COMPREHENSIVE METABOLIC PANEL
ALT: 26 IU/L (ref 0–32)
AST: 19 IU/L (ref 0–40)
Albumin/Globulin Ratio: 2.7 — ABNORMAL HIGH (ref 1.2–2.2)
Albumin: 5.1 g/dL — ABNORMAL HIGH (ref 3.8–4.9)
Alkaline Phosphatase: 109 IU/L (ref 44–121)
BUN/Creatinine Ratio: 20 (ref 9–23)
BUN: 16 mg/dL (ref 6–24)
Bilirubin Total: 0.3 mg/dL (ref 0.0–1.2)
CO2: 28 mmol/L (ref 20–29)
Calcium: 10.7 mg/dL — ABNORMAL HIGH (ref 8.7–10.2)
Chloride: 99 mmol/L (ref 96–106)
Creatinine, Ser: 0.82 mg/dL (ref 0.57–1.00)
Globulin, Total: 1.9 g/dL (ref 1.5–4.5)
Glucose: 98 mg/dL (ref 70–99)
Potassium: 4.6 mmol/L (ref 3.5–5.2)
Sodium: 140 mmol/L (ref 134–144)
Total Protein: 7 g/dL (ref 6.0–8.5)
eGFR: 82 mL/min/{1.73_m2} (ref >59)

## 2021-05-04 LAB — CBC WITH DIFFERENTIAL/PLATELET
Basophils Absolute: 0 10*3/uL (ref 0.0–0.2)
Basos: 1 %
EOS (ABSOLUTE): 0.1 10*3/uL (ref 0.0–0.4)
Eos: 1 %
Hematocrit: 43.2 % (ref 34.0–46.6)
Hemoglobin: 14.9 g/dL (ref 11.1–15.9)
Immature Grans (Abs): 0 10*3/uL (ref 0.0–0.1)
Immature Granulocytes: 0 %
Lymphocytes Absolute: 2.2 10*3/uL (ref 0.7–3.1)
Lymphs: 39 %
MCH: 32.4 pg (ref 26.6–33.0)
MCHC: 34.5 g/dL (ref 31.5–35.7)
MCV: 94 fL (ref 79–97)
Monocytes Absolute: 0.4 10*3/uL (ref 0.1–0.9)
Monocytes: 7 %
Neutrophils Absolute: 2.8 10*3/uL (ref 1.4–7.0)
Neutrophils: 52 %
Platelets: 302 10*3/uL (ref 150–450)
RBC: 4.6 x10E6/uL (ref 3.77–5.28)
RDW: 11.7 % (ref 11.7–15.4)
WBC: 5.5 10*3/uL (ref 3.4–10.8)

## 2021-05-04 LAB — TSH: TSH: 1.24 u[IU]/mL (ref 0.450–4.500)

## 2021-06-18 ENCOUNTER — Telehealth: Payer: Self-pay | Admitting: Acute Care

## 2021-06-18 ENCOUNTER — Other Ambulatory Visit: Payer: Self-pay

## 2021-06-18 DIAGNOSIS — Z87891 Personal history of nicotine dependence: Secondary | ICD-10-CM

## 2021-06-18 DIAGNOSIS — Z122 Encounter for screening for malignant neoplasm of respiratory organs: Secondary | ICD-10-CM

## 2021-06-18 NOTE — Telephone Encounter (Signed)
Spoke with patient to schedule annual LDCT.  Patient states she would like to confer with her insurance, as she had 'a bill' for her CT last year.  She has [facility] phone number for call back ?

## 2021-07-04 ENCOUNTER — Other Ambulatory Visit (INDEPENDENT_AMBULATORY_CARE_PROVIDER_SITE_OTHER): Payer: Commercial Managed Care - HMO

## 2021-07-04 DIAGNOSIS — Z23 Encounter for immunization: Secondary | ICD-10-CM | POA: Diagnosis not present

## 2021-07-05 LAB — CALCIUM: Calcium: 9.7 mg/dL (ref 8.7–10.3)

## 2021-07-27 ENCOUNTER — Encounter: Payer: Self-pay | Admitting: *Deleted

## 2021-08-13 ENCOUNTER — Ambulatory Visit
Admission: RE | Admit: 2021-08-13 | Discharge: 2021-08-13 | Disposition: A | Discharge status: Home / Self Care | Payer: Commercial Managed Care - HMO | Source: Ambulatory Visit | Attending: Acute Care | Admitting: Acute Care

## 2021-08-13 ENCOUNTER — Ambulatory Visit: Payer: Commercial Managed Care - HMO

## 2021-08-13 DIAGNOSIS — Z87891 Personal history of nicotine dependence: Secondary | ICD-10-CM

## 2021-08-13 DIAGNOSIS — Z122 Encounter for screening for malignant neoplasm of respiratory organs: Secondary | ICD-10-CM

## 2021-08-14 ENCOUNTER — Other Ambulatory Visit: Payer: Self-pay | Admitting: Acute Care

## 2021-08-14 ENCOUNTER — Telehealth: Payer: Self-pay | Admitting: Acute Care

## 2021-08-14 DIAGNOSIS — Z122 Encounter for screening for malignant neoplasm of respiratory organs: Secondary | ICD-10-CM

## 2021-08-14 DIAGNOSIS — Z87891 Personal history of nicotine dependence: Secondary | ICD-10-CM

## 2021-08-14 NOTE — Telephone Encounter (Signed)
Returned call to patient requesting review of LDCT results.  Rads2 reading with no suspicious findings for lung cancer.  Atherosclerosis and emphysema, as previously noted. Patient is on statin medication.  Right upper lobe nodule versus mucoid impaction.  Patient had recent chest cold but is improving.  Patient had question regarding pectus excavatum deformity.  Discussed this is incidental finding characterized by a chest indention.  Patient states she has never noticed anything and has never been told she had this. Patient verbalized understanding of results and had no further questions.

## 2021-10-21 ENCOUNTER — Other Ambulatory Visit: Payer: Self-pay | Admitting: Internal Medicine

## 2021-10-21 DIAGNOSIS — I700 Atherosclerosis of aorta: Secondary | ICD-10-CM

## 2021-10-23 NOTE — Telephone Encounter (Signed)
Requested Prescriptions  Pending Prescriptions Disp Refills  . atorvastatin (LIPITOR) 10 MG tablet [Pharmacy Med Name: ATORVASTATIN '10MG'$  TABLETS] 90 tablet 1    Sig: TAKE 1 TABLET(10 MG) BY MOUTH DAILY     Cardiovascular:  Antilipid - Statins Failed - 10/21/2021  3:35 AM      Failed - Lipid Panel in normal range within the last 12 months    Cholesterol, Total  Date Value Ref Range Status  05/03/2021 140 100 - 199 mg/dL Final   LDL Chol Calc (NIH)  Date Value Ref Range Status  05/03/2021 55 0 - 99 mg/dL Final   HDL  Date Value Ref Range Status  05/03/2021 65 >39 mg/dL Final   Triglycerides  Date Value Ref Range Status  05/03/2021 110 0 - 149 mg/dL Final         Passed - Patient is not pregnant      Passed - Valid encounter within last 12 months    Recent Outpatient Visits          5 months ago Annual physical exam   [facility], Jesse Sans, MD   1 year ago Annual physical exam   [facility], Jesse Sans, MD   1 year ago Acute non-recurrent maxillary sinusitis   [facility] [place], Jesse Sans, MD   2 years ago Annual physical exam   [facility], Jesse Sans, MD   3 years ago Annual physical exam   [facility], Jesse Sans, MD      Future Appointments            In 2 weeks Army Melia, Jesse Sans, MD [facility] at [facility], [facility]   In 6 months Army Melia, Jesse Sans, MD [place] [facility] at [facility], [facility]

## 2021-11-06 ENCOUNTER — Ambulatory Visit (INDEPENDENT_AMBULATORY_CARE_PROVIDER_SITE_OTHER): Payer: Commercial Managed Care - HMO | Admitting: Internal Medicine

## 2021-11-06 ENCOUNTER — Encounter: Payer: Self-pay | Admitting: Internal Medicine

## 2021-11-06 VITALS — BP 132/76 | HR 78 | Ht 66.5 in | Wt 151.0 lb

## 2021-11-06 DIAGNOSIS — F411 Generalized anxiety disorder: Secondary | ICD-10-CM

## 2021-11-06 DIAGNOSIS — I700 Atherosclerosis of aorta: Secondary | ICD-10-CM | POA: Diagnosis not present

## 2021-11-06 DIAGNOSIS — J449 Chronic obstructive pulmonary disease, unspecified: Secondary | ICD-10-CM | POA: Diagnosis not present

## 2021-11-06 DIAGNOSIS — Z23 Encounter for immunization: Secondary | ICD-10-CM | POA: Diagnosis not present

## 2021-11-06 NOTE — Progress Notes (Signed)
  Date:  11/06/2021   Name:  Denise Fernandez   DOB:  03/11/1960   MRN:  [id number]   Chief Complaint: Hyperlipidemia and Anxiety  Hyperlipidemia This is a chronic problem. The problem is controlled. Pertinent negatives include no chest pain or shortness of breath. Current antihyperlipidemic treatment includes statins. The current treatment provides significant improvement of lipids.  Anxiety Presents for follow-up visit. Patient reports no chest pain, dizziness, nervous/anxious behavior, palpitations or shortness of breath. Symptoms occur occasionally.    COPD - doing well, using inhalers only as needed.  No chest infections or other concerns.  She remains tobacco free.  Lab Results  Component Value Date   NA 140 05/03/2021   K 4.6 05/03/2021   CO2 28 05/03/2021   GLUCOSE 98 05/03/2021   BUN 16 05/03/2021   CREATININE 0.82 05/03/2021   CALCIUM 9.7 07/04/2021   EGFR 82 05/03/2021   GFRNONAA 85 04/27/2019   Lab Results  Component Value Date   CHOL 140 05/03/2021   HDL 65 05/03/2021   LDLCALC 55 05/03/2021   TRIG 110 05/03/2021   CHOLHDL 2.2 05/03/2021   Lab Results  Component Value Date   TSH 1.240 05/03/2021   No results found for: "HGBA1C" Lab Results  Component Value Date   WBC 5.5 05/03/2021   HGB 14.9 05/03/2021   HCT 43.2 05/03/2021   MCV 94 05/03/2021   PLT 302 05/03/2021   Lab Results  Component Value Date   ALT 26 05/03/2021   AST 19 05/03/2021   ALKPHOS 109 05/03/2021   BILITOT 0.3 05/03/2021   No results found for: "25OHVITD2", "25OHVITD3", "VD25OH"   Review of Systems  Constitutional:  Positive for unexpected weight change (has lost 20 lbs with diet change). Negative for fatigue.  HENT:  Negative for nosebleeds.   Eyes:  Negative for visual disturbance.  Respiratory:  Negative for cough, chest tightness, shortness of breath and wheezing.   Cardiovascular:  Negative for chest pain, palpitations and leg swelling.  Gastrointestinal:  Negative  for abdominal pain, constipation and diarrhea.  Neurological:  Negative for dizziness, weakness, light-headedness and headaches.  Psychiatric/Behavioral:  Negative for dysphoric mood and sleep disturbance. The patient is not nervous/anxious.     Patient Active Problem List   Diagnosis Date Noted   S/P total hysterectomy 04/30/2020   Adenomatous colon polyp    Polyp of ascending colon    Tobacco use disorder, moderate, in sustained remission 04/23/2018   Atherosclerosis of aorta (HCC) 04/23/2018   Stage 2 moderate COPD by GOLD classification (HCC) 04/22/2017   Generalized anxiety disorder 01/18/2015   Asymptomatic bacteriuria 01/10/2015   Fibrocystic breast disease (FCBD) 01/10/2015    No Known Allergies  Past Surgical History:  Procedure Laterality Date   COLONOSCOPY WITH PROPOFOL N/A 05/31/2019   Procedure: COLONOSCOPY WITH BIOPSY;  Surgeon: Wohl, Darren, MD;  Location: [facility];  Service: Endoscopy;  Laterality: N/A;  priority 4 wants early as possible   NEPHROLITHOTOMY  2012   Dr. [employer]   POLYPECTOMY N/A 05/31/2019   Procedure: POLYPECTOMY;  Surgeon: Wohl, Darren, MD;  Location: [facility];  Service: Endoscopy;  Laterality: N/A;  Ascending Colon Polyp removal site closed with 2 Clips   TONSILLECTOMY     TOTAL ABDOMINAL HYSTERECTOMY  2001   Ovaries remain.    Social History   Tobacco Use   Smoking status: Former    Packs/day: 2.00    Years: 30.00    Total pack years: 60.00      Types: Cigarettes    Quit date: 02/18/2014    Years since quitting: 7.7   Smokeless tobacco: Never  Vaping Use   Vaping Use: Never used  Substance Use Topics   Alcohol use: Yes    Alcohol/week: 0.0 standard drinks of alcohol    Comment: occasional   Drug use: No     Medication list has been reviewed and updated.  Current Meds  Medication Sig   ALPRAZolam (XANAX) 1 MG tablet Take 1 tablet (1 mg total) by mouth at bedtime as needed for anxiety.   Ascorbic  Acid (VITAMIN C) 1000 MG tablet Take 1,000 mg by mouth daily.   atorvastatin (LIPITOR) 10 MG tablet TAKE 1 TABLET(10 MG) BY MOUTH DAILY   cholecalciferol (VITAMIN D3) 25 MCG (1000 UNIT) tablet Take 1,000 Units by mouth daily.   meloxicam (MOBIC) 15 MG tablet TAKE 1 TABLET(15 MG) BY MOUTH DAILY AS NEEDED   nystatin cream (MYCOSTATIN) Apply 1 application topically 2 (two) times daily.   PROAIR HFA 108 (90 Base) MCG/ACT inhaler INL 2 INHALATIONS ITL Q 6 H PRF WHZ   SPIRIVA HANDIHALER 18 MCG inhalation capsule    vitamin E 1000 UNIT capsule Take 1,000 Units by mouth daily.   [DISCONTINUED] [place]-Glucosamine-Vit D (OSTEO BI-FLEX ONE PER DAY PO) Take by mouth daily.       11/06/2021    8:15 AM 05/03/2021    8:13 AM 05/01/2020    7:54 AM 01/18/2020    2:11 PM  GAD 7 : Generalized Anxiety Score  Nervous, Anxious, on Edge _0 Control/stop worrying _1 Worry too much - different things _2 Trouble relaxing 0 1 0 2  Restless 0 0 0 2  Easily annoyed or irritable 0 1 0 2  Afraid - awful might happen 0 1 0 0  Total GAD 7 Score _3 Anxiety Difficulty Not difficult at all Somewhat difficult  Somewhat difficult       11/06/2021    8:15 AM 05/03/2021    8:13 AM 05/01/2020    7:54 AM  Depression screen PHQ 2/9  Decreased Interest 1 0 0  Down, Depressed, Hopeless 0 0 0  PHQ - 2 Score 1 0 0  Altered sleeping 1 0 0  Tired, decreased energy 0 0 0  Change in appetite 0 0 0  Feeling bad or failure about yourself  0 0 0  Trouble concentrating 1 0 0  Moving slowly or fidgety/restless 0 0 0  Suicidal thoughts 0 0 0  PHQ-9 Score 3 0 0  Difficult doing work/chores Not difficult at all Not difficult at all     BP Readings from Last 3 Encounters:  11/06/21 132/76  05/03/21 122/78  05/01/20 110/72    Physical Exam Vitals and nursing note reviewed.  Constitutional:      General: She is not in acute distress.    Appearance: Normal appearance. She is well-developed.  HENT:      Head: Normocephalic and atraumatic.  Neck:     Vascular: No carotid bruit.  Cardiovascular:     Rate and Rhythm: Normal rate and regular rhythm.     Pulses: Normal pulses.  Pulmonary:     Effort: Pulmonary effort is normal. No respiratory distress.     Breath sounds: No wheezing or rhonchi.  Musculoskeletal:     Cervical back: Normal range of motion.     Right lower leg: No edema.  Left lower leg: No edema.  Lymphadenopathy:     Cervical: No cervical adenopathy.  Skin:    General: Skin is warm and dry.     Capillary Refill: Capillary refill takes less than 2 seconds.     Findings: No rash.  Neurological:     General: No focal deficit present.     Mental Status: She is alert and oriented to person, place, and time.  Psychiatric:        Mood and Affect: Mood normal.        Behavior: Behavior normal.     Wt Readings from Last 3 Encounters:  11/06/21 151 lb (68.5 kg)  05/03/21 172 lb (78 kg)  08/10/20 165 lb (74.8 kg)    BP 132/76 (BP Location: Right Arm, Cuff Size: Normal)   Pulse 78   Ht 5' 6.5" (1.689 m)   Wt 151 lb (68.5 kg)   SpO2 96%   BMI 24.01 kg/m   Assessment and Plan: 1. Stage 2 moderate COPD by GOLD classification ([place]) Symptoms are well controlled on albuterol and spiriva. No recent infections.  2. Generalized anxiety disorder Taking clonazepam only as needed Symptoms improved since changing jobs working closer to home.  3. Hypercalcemia Resolved after stopping supplements.  4. Atherosclerosis of aorta (HCC) Continue statin therapy and diet changes.  5. Need for immunization against influenza - Flu Vaccine QUAD 83moIM (Fluarix, Fluzone & Alfiuria Quad PF)   Partially dictated using [employer], commissioning Any errors are unintentional.  LHalina Maidens MD [facility]  11/06/2021

## 2022-02-03 ENCOUNTER — Other Ambulatory Visit: Payer: Self-pay | Admitting: Internal Medicine

## 2022-02-03 DIAGNOSIS — F411 Generalized anxiety disorder: Secondary | ICD-10-CM

## 2022-03-22 ENCOUNTER — Telehealth: Payer: Self-pay | Admitting: Gastroenterology

## 2022-03-22 ENCOUNTER — Other Ambulatory Visit: Payer: Self-pay

## 2022-03-22 DIAGNOSIS — Z86010 Personal history of colonic polyps: Secondary | ICD-10-CM

## 2022-03-22 MED ORDER — PEG 3350-KCL-NA BICARB-NACL 420 G PO SOLR: 4000.0000 mL | Freq: Once | ORAL | 0 refills | Status: AC

## 2022-03-22 NOTE — Telephone Encounter (Signed)
Patient calling to schedule colonoscopy. States she prefers a Tuesday. Requesting call back.

## 2022-03-22 NOTE — Telephone Encounter (Signed)
Gastroenterology Pre-Procedure Review  Request Date: Jun 24, 2022 Requesting Physician: Dr. Allen Norris  PATIENT REVIEW QUESTIONS: The patient responded to the following health history questions as indicated:    1. Are you having any GI issues? no 2. Do you have a personal history of Polyps? yes (last colonoscopy performed by Dr. Allen Norris on 05/31/19 noted polyps) 3. Do you have a family history of Colon Cancer or Polyps? yes (uncles had colon polyps) 4. Diabetes Mellitus? no 5. Joint replacements in the past 12 months?no 6. Major health problems in the past 3 months?no 7. Any artificial heart valves, MVP, or defibrillator?no    MEDICATIONS & ALLERGIES:    Patient reports the following regarding taking any anticoagulation/antiplatelet therapy:   Plavix, Coumadin, Eliquis, Xarelto, Lovenox, Pradaxa, Brilinta, or Effient? no Aspirin? no  Patient confirms/reports the following medications:  Current Outpatient Medications  Medication Sig Dispense Refill   ALPRAZolam (XANAX) 1 MG tablet TAKE 1 TABLET(1 MG) BY MOUTH AT BEDTIME AS NEEDED FOR ANXIETY 30 tablet 1   Ascorbic Acid (VITAMIN C) 1000 MG tablet Take 1,000 mg by mouth daily.     atorvastatin (LIPITOR) 10 MG tablet TAKE 1 TABLET(10 MG) BY MOUTH DAILY 90 tablet 1   cholecalciferol (VITAMIN D3) 25 MCG (1000 UNIT) tablet Take 1,000 Units by mouth daily.     meloxicam (MOBIC) 15 MG tablet TAKE 1 TABLET(15 MG) BY MOUTH DAILY AS NEEDED 30 tablet 1   nystatin cream (MYCOSTATIN) Apply 1 application topically 2 (two) times daily. 30 g 1   PROAIR HFA 108 (90 Base) MCG/ACT inhaler INL 2 INHALATIONS ITL Q 6 H PRF WHZ  12   SPIRIVA HANDIHALER 18 MCG inhalation capsule   10   vitamin E 1000 UNIT capsule Take 1,000 Units by mouth daily.     No current facility-administered medications for this visit.    Patient confirms/reports the following allergies:  No Known Allergies  No orders of the defined types were placed in this encounter.   AUTHORIZATION  INFORMATION Primary Insurance: 1D#: Group #:  Secondary Insurance: 1D#: Group #:  SCHEDULE INFORMATION: Date: 06/24/22 Time: Location: [facility]

## 2022-03-22 NOTE — Addendum Note (Signed)
Addended by: Vanetta Mulders on: 03/22/2022 09:45 AM   Modules accepted: Orders

## 2022-04-30 ENCOUNTER — Other Ambulatory Visit: Payer: Self-pay | Admitting: Internal Medicine

## 2022-04-30 DIAGNOSIS — I700 Atherosclerosis of aorta: Secondary | ICD-10-CM

## 2022-05-09 ENCOUNTER — Ambulatory Visit (INDEPENDENT_AMBULATORY_CARE_PROVIDER_SITE_OTHER): Payer: 59 | Admitting: Internal Medicine

## 2022-05-09 ENCOUNTER — Encounter: Payer: Self-pay | Admitting: Internal Medicine

## 2022-05-09 VITALS — BP 134/68 | HR 79 | Ht 66.5 in | Wt 144.2 lb

## 2022-05-09 DIAGNOSIS — F411 Generalized anxiety disorder: Secondary | ICD-10-CM

## 2022-05-09 DIAGNOSIS — F17201 Nicotine dependence, unspecified, in remission: Secondary | ICD-10-CM | POA: Diagnosis not present

## 2022-05-09 DIAGNOSIS — Z1211 Encounter for screening for malignant neoplasm of colon: Secondary | ICD-10-CM | POA: Diagnosis not present

## 2022-05-09 DIAGNOSIS — Z0000 Encounter for general adult medical examination without abnormal findings: Secondary | ICD-10-CM | POA: Diagnosis not present

## 2022-05-09 DIAGNOSIS — Z1231 Encounter for screening mammogram for malignant neoplasm of breast: Secondary | ICD-10-CM | POA: Diagnosis not present

## 2022-05-09 DIAGNOSIS — I700 Atherosclerosis of aorta: Secondary | ICD-10-CM

## 2022-05-09 DIAGNOSIS — Z23 Encounter for immunization: Secondary | ICD-10-CM | POA: Diagnosis not present

## 2022-05-09 DIAGNOSIS — J449 Chronic obstructive pulmonary disease, unspecified: Secondary | ICD-10-CM

## 2022-05-09 MED ORDER — ATORVASTATIN CALCIUM 10 MG PO TABS: ORAL_TABLET | ORAL | 1 refills | Status: DC

## 2022-05-09 NOTE — Patient Instructions (Signed)
Call [facility] to schedule your mammogram at [phone].  

## 2022-05-09 NOTE — Progress Notes (Signed)
Date:  05/09/2022   Name:  Denise Fernandez   DOB:  May 31, 1960   MRN:  [id number]   Chief Complaint: Annual Exam Denise Fernandez is a 62 y.o. female who presents today for her Complete Annual Exam. She feels well. She reports exercising/ walking. She reports she is sleeping fairly well. Breast complaints - none.  Mammogram: 02/2021 DEXA: none Pap smear: discontinued Colonoscopy: 05/2019 repeat 3 yrs - scheduled  Health Maintenance Due  Topic Date Due   COVID-19 Vaccine (1) Never done   HIV Screening  Never done   DTaP/Tdap/Td (1 - Tdap) Never done   MAMMOGRAM  03/06/2022    Immunization History  Administered Date(s) Administered   Influenza Inj Mdck Quad Pf 11/28/2017   Influenza,inj,Quad PF,6+ Mos 11/21/2016, 11/06/2021   Influenza-Unspecified 11/19/2014, 11/21/2016, 11/28/2017, 11/19/2019, 11/27/2020   PNEUMOCOCCAL CONJUGATE-20 05/09/2022   Pneumococcal Polysaccharide-23 04/22/2017   Zoster Recombinat (Shingrix) 05/03/2021, 07/04/2021    Hyperlipidemia This is a chronic problem. The problem is controlled. Pertinent negatives include no chest pain or shortness of breath. Current antihyperlipidemic treatment includes statins. The current treatment provides significant improvement of lipids.  Anxiety Presents for follow-up visit. Patient reports no chest pain, dizziness, nervous/anxious behavior, palpitations or shortness of breath. Symptoms occur most days.    COPD - up to date on Prevnar 37; on Spiriva daily with PRN albuterol. She remains tobacco free.  She is participating in lung cancer screening program.  Lab Results  Component Value Date   NA 140 05/03/2021   K 4.6 05/03/2021   CO2 28 05/03/2021   GLUCOSE 98 05/03/2021   BUN 16 05/03/2021   CREATININE 0.82 05/03/2021   CALCIUM 9.7 07/04/2021   EGFR 82 05/03/2021   GFRNONAA 85 04/27/2019   Lab Results  Component Value Date   CHOL 140 05/03/2021   HDL 65 05/03/2021   LDLCALC 55 05/03/2021   TRIG 110  05/03/2021   CHOLHDL 2.2 05/03/2021   Lab Results  Component Value Date   TSH 1.240 05/03/2021   No results found for: "HGBA1C" Lab Results  Component Value Date   WBC 5.5 05/03/2021   HGB 14.9 05/03/2021   HCT 43.2 05/03/2021   MCV 94 05/03/2021   PLT 302 05/03/2021   Lab Results  Component Value Date   ALT 26 05/03/2021   AST 19 05/03/2021   ALKPHOS 109 05/03/2021   BILITOT 0.3 05/03/2021   No results found for: "25OHVITD2", "25OHVITD3", "VD25OH"   Review of Systems  Constitutional:  Negative for chills, fatigue and fever.  HENT:  Negative for congestion, hearing loss, tinnitus, trouble swallowing and voice change.   Eyes:  Negative for visual disturbance.  Respiratory:  Negative for cough, chest tightness, shortness of breath and wheezing.   Cardiovascular:  Negative for chest pain, palpitations and leg swelling.  Gastrointestinal:  Negative for abdominal pain, constipation, diarrhea and vomiting.  Endocrine: Negative for polydipsia and polyuria.  Genitourinary:  Negative for dysuria, frequency, genital sores, vaginal bleeding and vaginal discharge.  Musculoskeletal:  Negative for arthralgias, gait problem and joint swelling.  Skin:  Negative for color change and rash.  Neurological:  Negative for dizziness, tremors, light-headedness and headaches.  Hematological:  Negative for adenopathy. Does not bruise/bleed easily.  Psychiatric/Behavioral:  Negative for dysphoric mood and sleep disturbance. The patient is not nervous/anxious.     Patient Active Problem List   Diagnosis Date Noted   S/P total hysterectomy 04/30/2020   Adenomatous colon polyp    Polyp  of ascending colon    Tobacco use disorder, moderate, in sustained remission 04/23/2018   Atherosclerosis of aorta ([place]) 04/23/2018   Stage 2 moderate COPD by GOLD classification ([place]) 04/22/2017   Generalized anxiety disorder 01/18/2015   Asymptomatic bacteriuria 01/10/2015   Fibrocystic breast disease (FCBD)  01/10/2015    No Known Allergies  Past Surgical History:  Procedure Laterality Date   COLONOSCOPY WITH PROPOFOL N/A 05/31/2019   Procedure: COLONOSCOPY WITH BIOPSY;  Surgeon: Lucilla Lame, MD;  Location: [facility];  Service: Endoscopy;  Laterality: N/A;  priority 4 wants early as possible   NEPHROLITHOTOMY  2012   Dr. Marcelline Mates   POLYPECTOMY N/A 05/31/2019   Procedure: POLYPECTOMY;  Surgeon: Lucilla Lame, MD;  Location: [facility];  Service: Endoscopy;  Laterality: N/A;  Ascending Colon Polyp removal site closed with 2 Clips   TONSILLECTOMY     TOTAL ABDOMINAL HYSTERECTOMY  2001   Ovaries remain.    Social History   Tobacco Use   Smoking status: Former    Packs/day: 2.00    Years: 30.00    Additional pack years: 0.00    Total pack years: 60.00    Types: Cigarettes    Quit date: 02/18/2014    Years since quitting: 8.2   Smokeless tobacco: Never  Vaping Use   Vaping Use: Never used  Substance Use Topics   Alcohol use: Yes    Alcohol/week: 0.0 standard drinks of alcohol    Comment: occasional   Drug use: No     Medication list has been reviewed and updated.  Current Meds  Medication Sig   ALPRAZolam (XANAX) 1 MG tablet TAKE 1 TABLET(1 MG) BY MOUTH AT BEDTIME AS NEEDED FOR ANXIETY   Ascorbic Acid (VITAMIN C) 1000 MG tablet Take 1,000 mg by mouth daily.   cholecalciferol (VITAMIN D3) 25 MCG (1000 UNIT) tablet Take 1,000 Units by mouth daily.   meloxicam (MOBIC) 15 MG tablet TAKE 1 TABLET(15 MG) BY MOUTH DAILY AS NEEDED   nystatin cream (MYCOSTATIN) Apply 1 application topically 2 (two) times daily.   PROAIR HFA 108 (90 Base) MCG/ACT inhaler INL 2 INHALATIONS ITL Q 6 H PRF WHZ   SPIRIVA HANDIHALER 18 MCG inhalation capsule    vitamin E 1000 UNIT capsule Take 1,000 Units by mouth daily.   [DISCONTINUED] atorvastatin (LIPITOR) 10 MG tablet TAKE 1 TABLET(10 MG) BY MOUTH DAILY       05/09/2022    8:10 AM 11/06/2021    8:15 AM 05/03/2021    8:13  AM 05/01/2020    7:54 AM  GAD 7 : Generalized Anxiety Score  Nervous, Anxious, on Edge 1 1 1 1   Control/stop worrying 1 1 2 1   Worry too much - different things 1 1 1 1   Trouble relaxing 0 0 1 0  Restless 0 0 0 0  Easily annoyed or irritable 1 0 1 0  Afraid - awful might happen 1 0 1 0  Total GAD 7 Score 5 3 7 3   Anxiety Difficulty Not difficult at all Not difficult at all Somewhat difficult        05/09/2022    8:10 AM 11/06/2021    8:15 AM 05/03/2021    8:13 AM  Depression screen PHQ 2/9  Decreased Interest 0 1 0  Down, Depressed, Hopeless 0 0 0  PHQ - 2 Score 0 1 0  Altered sleeping 1 1 0  Tired, decreased energy 0 0 0  Change in appetite 0 0  0  Feeling bad or failure about yourself  0 0 0  Trouble concentrating 0 1 0  Moving slowly or fidgety/restless 0 0 0  Suicidal thoughts 0 0 0  PHQ-9 Score 1 3 0  Difficult doing work/chores Not difficult at all Not difficult at all Not difficult at all    BP Readings from Last 3 Encounters:  05/09/22 134/68  11/06/21 132/76  05/03/21 122/78    Physical Exam Vitals and nursing note reviewed.  Constitutional:      General: She is not in acute distress.    Appearance: She is well-developed.  HENT:     Head: Normocephalic and atraumatic.     Right Ear: Tympanic membrane and ear canal normal.     Left Ear: Tympanic membrane and ear canal normal.     Nose:     Right Sinus: No maxillary sinus tenderness.     Left Sinus: No maxillary sinus tenderness.  Eyes:     General: No scleral icterus.       Right eye: No discharge.        Left eye: No discharge.     Conjunctiva/sclera: Conjunctivae normal.  Neck:     Thyroid: No thyromegaly.     Vascular: No carotid bruit.  Cardiovascular:     Rate and Rhythm: Normal rate and regular rhythm.     Pulses: Normal pulses.     Heart sounds: Normal heart sounds.  Pulmonary:     Effort: Pulmonary effort is normal. No respiratory distress.     Breath sounds: No wheezing.  Chest:   Breasts:    Right: No mass, nipple discharge, skin change or tenderness.     Left: No mass, nipple discharge, skin change or tenderness.  Abdominal:     General: Bowel sounds are normal.     Palpations: Abdomen is soft.     Tenderness: There is no abdominal tenderness.  Musculoskeletal:     Cervical back: Normal range of motion. No erythema.     Right lower leg: No edema.     Left lower leg: No edema.  Lymphadenopathy:     Cervical: No cervical adenopathy.  Skin:    General: Skin is warm and dry.     Findings: No rash.  Neurological:     Mental Status: She is alert and oriented to person, place, and time.     Cranial Nerves: No cranial nerve deficit.     Sensory: No sensory deficit.     Deep Tendon Reflexes: Reflexes are normal and symmetric.  Psychiatric:        Attention and Perception: Attention normal.        Mood and Affect: Mood normal.     Wt Readings from Last 3 Encounters:  05/09/22 144 lb 3.2 oz (65.4 kg)  11/06/21 151 lb (68.5 kg)  05/03/21 172 lb (78 kg)    BP 134/68   Pulse 79   Ht 5' 6.5" (1.689 m)   Wt 144 lb 3.2 oz (65.4 kg)   SpO2 99%   BMI 22.93 kg/m   Assessment and Plan:  Problem List Items Addressed This Visit       Cardiovascular and Mediastinum   Atherosclerosis of aorta (HCC) (Chronic)    On statin therapy for risk reduction      Relevant Medications   atorvastatin (LIPITOR) 10 MG tablet   Other Relevant Orders   Comprehensive metabolic panel   Lipid panel     Respiratory   Stage 2  moderate COPD by GOLD classification (HCC) (Chronic)    Stable shortness of breath without recent flare On Spiriva and Proair as needed only      Relevant Orders   CBC with Differential/Platelet     Other   Tobacco use disorder, moderate, in sustained remission   Relevant Orders   Ambulatory Referral for Lung Cancer Scre   Generalized anxiety disorder (Chronic)    Stable symptoms Takes xanax nightly to aid in sleep      Relevant Orders    TSH   Other Visit Diagnoses     Annual physical exam    -  Primary   Relevant Orders   CBC with Differential/Platelet   Comprehensive metabolic panel   Lipid panel   TSH   Encounter for screening mammogram for breast cancer       Relevant Orders   MM 3D SCREENING MAMMOGRAM BILATERAL BREAST   Colon cancer screening       Need for vaccination for pneumococcus       Relevant Orders   Pneumococcal conjugate vaccine 20-valent (Completed)       No follow-ups on file.   Partially dictated using [place], any errors are not intentional.  Glean Hess, MD [place], [place]

## 2022-05-09 NOTE — Assessment & Plan Note (Signed)
On statin therapy for risk reduction

## 2022-05-09 NOTE — Assessment & Plan Note (Addendum)
Stable shortness of breath without recent flare On Spiriva and Proair as needed only

## 2022-05-09 NOTE — Assessment & Plan Note (Signed)
Stable symptoms Takes xanax nightly to aid in sleep

## 2022-05-10 LAB — CBC WITH DIFFERENTIAL/PLATELET
Basophils Absolute: 0 10*3/uL (ref 0.0–0.2)
Basos: 1 %
EOS (ABSOLUTE): 0.1 10*3/uL (ref 0.0–0.4)
Eos: 2 %
Hematocrit: 42.6 % (ref 34.0–46.6)
Hemoglobin: 14.6 g/dL (ref 11.1–15.9)
Immature Grans (Abs): 0 10*3/uL (ref 0.0–0.1)
Immature Granulocytes: 0 %
Lymphocytes Absolute: 2.1 10*3/uL (ref 0.7–3.1)
Lymphs: 44 %
MCH: 31.9 pg (ref 26.6–33.0)
MCHC: 34.3 g/dL (ref 31.5–35.7)
MCV: 93 fL (ref 79–97)
Monocytes Absolute: 0.4 10*3/uL (ref 0.1–0.9)
Monocytes: 8 %
Neutrophils Absolute: 2.2 10*3/uL (ref 1.4–7.0)
Neutrophils: 45 %
Platelets: 281 10*3/uL (ref 150–450)
RBC: 4.57 x10E6/uL (ref 3.77–5.28)
RDW: 11.7 % (ref 11.7–15.4)
WBC: 4.8 10*3/uL (ref 3.4–10.8)

## 2022-05-10 LAB — COMPREHENSIVE METABOLIC PANEL
ALT: 25 IU/L (ref 0–32)
AST: 18 IU/L (ref 0–40)
Albumin/Globulin Ratio: 2.2 (ref 1.2–2.2)
Albumin: 4.9 g/dL (ref 3.8–4.9)
Alkaline Phosphatase: 101 IU/L (ref 44–121)
BUN/Creatinine Ratio: 24 (ref 12–28)
BUN: 16 mg/dL (ref 8–27)
Bilirubin Total: 0.4 mg/dL (ref 0.0–1.2)
CO2: 23 mmol/L (ref 20–29)
Calcium: 10.1 mg/dL (ref 8.7–10.3)
Chloride: 101 mmol/L (ref 96–106)
Creatinine, Ser: 0.68 mg/dL (ref 0.57–1.00)
Globulin, Total: 2.2 g/dL (ref 1.5–4.5)
Glucose: 95 mg/dL (ref 70–99)
Potassium: 4.6 mmol/L (ref 3.5–5.2)
Sodium: 141 mmol/L (ref 134–144)
Total Protein: 7.1 g/dL (ref 6.0–8.5)
eGFR: 100 mL/min/{1.73_m2} (ref >59)

## 2022-05-10 LAB — LIPID PANEL
Chol/HDL Ratio: 1.7 ratio (ref 0.0–4.4)
Cholesterol, Total: 130 mg/dL (ref 100–199)
HDL: 75 mg/dL (ref >39)
LDL Chol Calc (NIH): 42 mg/dL (ref 0–99)
Triglycerides: 62 mg/dL (ref 0–149)
VLDL Cholesterol Cal: 13 mg/dL (ref 5–40)

## 2022-05-10 LAB — TSH: TSH: 1.49 u[IU]/mL (ref 0.450–4.500)

## 2022-06-14 ENCOUNTER — Encounter: Payer: Self-pay | Admitting: Gastroenterology

## 2022-06-14 NOTE — Anesthesia Preprocedure Evaluation (Addendum)
Anesthesia Evaluation  Patient identified by MRN, date of birth, ID band Patient awake    Reviewed: Allergy & Precautions, H&P , NPO status , Patient's Chart, lab work & pertinent test results  History of Anesthesia Complications (+) history of anesthetic complications  Airway Mallampati: III  TM Distance: >3 FB Neck ROM: Full    Dental no notable dental hx.    Pulmonary shortness of breath, COPD, former smoker Used to smoke 2 1/2 PPD but quit some time ago   Pulmonary exam normal breath sounds clear to auscultation       Cardiovascular negative cardio ROS Normal cardiovascular exam Rhythm:Regular Rate:Normal     Neuro/Psych  Headaches PSYCHIATRIC DISORDERS Anxiety Depression       GI/Hepatic Neg liver ROS,GERD  ,,  Endo/Other  negative endocrine ROS    Renal/GU negative Renal ROS  negative genitourinary   Musculoskeletal negative musculoskeletal ROS (+)    Abdominal   Peds negative pediatric ROS (+)  Hematology negative hematology ROS (+)   Anesthesia Other Findings Major depressive disorder with single episode, in partial remission (HCC) Generalized anxiety disorder Slow to awaken from anesthesia Bulging of cervical intervertebral disc Headache COPD (chronic obstructive pulmonary disease) (HCC) Stage 2  TB (tuberculosis) was treated due to contact w/TB but never tested positive Dyspnea  GERD (gastroesophageal reflux disease)    Reproductive/Obstetrics negative OB ROS                             Anesthesia Physical Anesthesia Plan  ASA: 3  Anesthesia Plan: General   Post-op Pain Management:    Induction: Intravenous  PONV Risk Score and Plan:   Airway Management Planned: Natural Airway and Nasal Cannula  Additional Equipment:   Intra-op Plan:   Post-operative Plan:   Informed Consent: I have reviewed the patients History and Physical, chart, labs and discussed the  procedure including the risks, benefits and alternatives for the proposed anesthesia with the patient or authorized representative who has indicated his/her understanding and acceptance.     Dental Advisory Given  Plan Discussed with: Anesthesiologist, CRNA and Surgeon  Anesthesia Plan Comments: (Patient consented for risks of anesthesia including but not limited to:  - adverse reactions to medications - risk of airway placement if required - damage to eyes, teeth, lips or other oral mucosa - nerve damage due to positioning  - sore throat or hoarseness - Damage to heart, brain, nerves, lungs, other parts of body or loss of life  Patient voiced understanding.)       Anesthesia Quick Evaluation

## 2022-06-24 ENCOUNTER — Other Ambulatory Visit: Payer: Self-pay

## 2022-06-24 ENCOUNTER — Ambulatory Visit
Admission: RE | Admit: 2022-06-24 | Discharge: 2022-06-24 | Disposition: A | Discharge status: Home / Self Care | Payer: 59 | Attending: Gastroenterology | Admitting: Gastroenterology

## 2022-06-24 ENCOUNTER — Encounter: Payer: Self-pay | Admitting: Gastroenterology

## 2022-06-24 ENCOUNTER — Encounter
Admission: RE | Disposition: A | Discharge status: Home / Self Care | Payer: Self-pay | Source: Home / Self Care | Attending: Gastroenterology

## 2022-06-24 ENCOUNTER — Ambulatory Visit: Payer: 59 | Admitting: Anesthesiology

## 2022-06-24 DIAGNOSIS — F329 Major depressive disorder, single episode, unspecified: Secondary | ICD-10-CM | POA: Diagnosis not present

## 2022-06-24 DIAGNOSIS — Z09 Encounter for follow-up examination after completed treatment for conditions other than malignant neoplasm: Secondary | ICD-10-CM | POA: Diagnosis not present

## 2022-06-24 DIAGNOSIS — K635 Polyp of colon: Secondary | ICD-10-CM | POA: Diagnosis not present

## 2022-06-24 DIAGNOSIS — Z86010 Personal history of colonic polyps: Secondary | ICD-10-CM | POA: Insufficient documentation

## 2022-06-24 DIAGNOSIS — J449 Chronic obstructive pulmonary disease, unspecified: Secondary | ICD-10-CM | POA: Diagnosis not present

## 2022-06-24 DIAGNOSIS — F411 Generalized anxiety disorder: Secondary | ICD-10-CM | POA: Insufficient documentation

## 2022-06-24 DIAGNOSIS — K640 First degree hemorrhoids: Secondary | ICD-10-CM | POA: Diagnosis not present

## 2022-06-24 DIAGNOSIS — Z1211 Encounter for screening for malignant neoplasm of colon: Secondary | ICD-10-CM | POA: Diagnosis present

## 2022-06-24 DIAGNOSIS — Z87891 Personal history of nicotine dependence: Secondary | ICD-10-CM | POA: Insufficient documentation

## 2022-06-24 HISTORY — DX: Generalized anxiety disorder: F41.1

## 2022-06-24 HISTORY — PX: COLONOSCOPY WITH PROPOFOL: SHX5780

## 2022-06-24 HISTORY — PX: POLYPECTOMY: SHX5525

## 2022-06-24 SURGERY — COLONOSCOPY WITH PROPOFOL
Anesthesia: General | Site: Rectum

## 2022-06-24 MED ORDER — LACTATED RINGERS IV SOLN: INTRAVENOUS | Status: DC

## 2022-06-24 MED ORDER — SODIUM CHLORIDE 0.9 % IV SOLN: INTRAVENOUS | Status: DC

## 2022-06-24 MED ORDER — LIDOCAINE HCL (CARDIAC) PF 100 MG/5ML IV SOSY
PREFILLED_SYRINGE | INTRAVENOUS | Status: DC | PRN
  Administered 2022-06-24: 50 mg via INTRAVENOUS

## 2022-06-24 MED ORDER — STERILE WATER FOR IRRIGATION IR SOLN
Status: DC | PRN
  Administered 2022-06-24: 120 mL

## 2022-06-24 MED ORDER — PROPOFOL 10 MG/ML IV BOLUS
INTRAVENOUS | Status: DC | PRN
  Administered 2022-06-24: 25 mg via INTRAVENOUS
  Administered 2022-06-24: 40 mg via INTRAVENOUS
  Administered 2022-06-24: 25 mg via INTRAVENOUS
  Administered 2022-06-24: 80 mg via INTRAVENOUS
  Administered 2022-06-24 (×2): 25 mg via INTRAVENOUS
  Administered 2022-06-24: 30 mg via INTRAVENOUS

## 2022-06-24 MED ORDER — STERILE WATER FOR IRRIGATION IR SOLN
Status: DC | PRN
  Administered 2022-06-24: 1

## 2022-06-24 SURGICAL SUPPLY — 7 items
FORCEPS BIOP RAD 4 LRG CAP 4 (CUTTING FORCEPS) IMPLANT
GOWN CVR UNV OPN BCK APRN NK (MISCELLANEOUS) ×4 IMPLANT
GOWN ISOL THUMB LOOP REG UNIV (MISCELLANEOUS) ×4
KIT PRC NS LF DISP ENDO (KITS) ×2 IMPLANT
KIT PROCEDURE OLYMPUS (KITS) ×2
MANIFOLD NEPTUNE II (INSTRUMENTS) ×2 IMPLANT
WATER STERILE IRR 250ML POUR (IV SOLUTION) ×2 IMPLANT

## 2022-06-24 NOTE — Op Note (Signed)
[facility] Gastroenterology Patient Name: Denise Fernandez Procedure Date: 06/24/2022 7:09 AM MRN: [id number] Account #: [id number] Date of Birth: 01-Mar-1960 Admit Type: Outpatient Age: 62 Room: [facility] OR ROOM 01 Gender: Female Note Status: Finalized Instrument Name: [id number] Procedure:             Colonoscopy Indications:           High risk colon cancer surveillance: Personal history                         of colonic polyps Providers:             Midge Minium MD, MD Referring MD:          Midge Minium MD, MD (Referring MD), Bari Edward, MD                         (Referring MD) Medicines:             Propofol per Anesthesia Complications:         No immediate complications. Procedure:             Pre-Anesthesia Assessment:                        - Prior to the procedure, a History and Physical was                         performed, and patient medications and allergies were                         reviewed. The patient's tolerance of previous                         anesthesia was also reviewed. The risks and benefits                         of the procedure and the sedation options and risks                         were discussed with the patient. All questions were                         answered, and informed consent was obtained. Prior                         Anticoagulants: The patient has taken no anticoagulant                         or antiplatelet agents. ASA Grade Assessment: II - A                         patient with mild systemic disease. After reviewing                         the risks and benefits, the patient was deemed in                         satisfactory condition to undergo the procedure.  After obtaining informed consent, the colonoscope was                         passed under direct vision. Throughout the procedure,                         the patient's blood pressure, pulse, and oxygen                          saturations were monitored continuously. The                         Colonoscope was introduced through the anus and                         advanced to the the cecum, identified by appendiceal                         orifice and ileocecal valve. The colonoscopy was                         performed without difficulty. The patient tolerated                         the procedure well. The quality of the bowel                         preparation was excellent. Findings:      The perianal and digital rectal examinations were normal.      A 3 mm polyp was found in the sigmoid colon. The polyp was sessile. The       polyp was removed with a cold biopsy forceps. Resection and retrieval       were complete.      Non-bleeding internal hemorrhoids were found during retroflexion. The       hemorrhoids were Grade I (internal hemorrhoids that do not prolapse). Impression:            - One 3 mm polyp in the sigmoid colon, removed with a                         cold biopsy forceps. Resected and retrieved.                        - Non-bleeding internal hemorrhoids. Recommendation:        - Discharge patient to home.                        - Resume previous diet.                        - Continue present medications.                        - Await pathology results.                        - Repeat colonoscopy in 7 years for surveillance. Procedure Code(s):     --- Professional ---  16109, Colonoscopy, flexible; with biopsy, single or                         multiple Diagnosis Code(s):     --- Professional ---                        Z86.010, Personal history of colonic polyps                        D12.5, Benign neoplasm of sigmoid colon CPT copyright 2022 American Medical Association. All rights reserved. The codes documented in this report are preliminary and upon coder review may  be revised to meet current compliance requirements. Midge Minium MD, MD 06/24/2022 8:08:26 AM This  report has been signed electronically. Number of Addenda: 0 Note Initiated On: 06/24/2022 7:09 AM Scope Withdrawal Time: 0 hours 9 minutes 23 seconds  Total Procedure Duration: 0 hours 14 minutes 35 seconds  Estimated Blood Loss:  Estimated blood loss: none.      [facility]

## 2022-06-24 NOTE — Anesthesia Postprocedure Evaluation (Signed)
Anesthesia Post Note  Patient: Orthoptist  Procedure(s) Performed: COLONOSCOPY WITH BIOPSY (Rectum) POLYPECTOMY (Rectum)  Patient location during evaluation: PACU Anesthesia Type: General Level of consciousness: awake and alert Pain management: pain level controlled Vital Signs Assessment: post-procedure vital signs reviewed and stable Respiratory status: spontaneous breathing, nonlabored ventilation, respiratory function stable and patient connected to nasal cannula oxygen Cardiovascular status: blood pressure returned to baseline and stable Postop Assessment: no apparent nausea or vomiting Anesthetic complications: no   No notable events documented.   Last Vitals:  Vitals:   06/24/22 0809 06/24/22 0817  BP: 102/68 115/68  Pulse: 61 71  Resp: 18 16  Temp: 36.5 C (!) 36.4 C  SpO2: 97% 99%    Last Pain:  Vitals:   06/24/22 0817  TempSrc:   PainSc: 0-No pain                 Bevely Palmer [place]

## 2022-06-24 NOTE — Transfer of Care (Signed)
Immediate Anesthesia Transfer of Care Note  Patient: Denise Fernandez  Procedure(s) Performed: COLONOSCOPY WITH BIOPSY (Rectum) POLYPECTOMY (Rectum)  Patient Location: PACU  Anesthesia Type: General  Level of Consciousness: awake, alert  and patient cooperative  Airway and Oxygen Therapy: Patient Spontanous Breathing and Patient connected to supplemental oxygen  Post-op Assessment: Post-op Vital signs reviewed, Patient's Cardiovascular Status Stable, Respiratory Function Stable, Patent Airway and No signs of Nausea or vomiting  Post-op Vital Signs: Reviewed and stable  Complications: No notable events documented.

## 2022-06-24 NOTE — H&P (Signed)
Denise Minium, MD [facility] [address], [address] [place], [place] [address] Phone:[phone] Fax : [phone]  Primary Care Physician:  Reubin Milan, MD Primary Gastroenterologist:  Dr. Servando Snare  Pre-Procedure History & Physical: HPI:  Denise Fernandez is a 62 y.o. female is here for an colonoscopy.   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 Ova Freshwater   POLYPECTOMY N/A 05/31/2019   Procedure: POLYPECTOMY;  Surgeon: Denise Minium, MD;  Location: [facility];  Service: Endoscopy;  Laterality: N/A;  Ascending Colon Polyp removal site closed with 2 Clips   TONSILLECTOMY     TOTAL ABDOMINAL HYSTERECTOMY  2001   Ovaries remain.    Prior to Admission medications   Medication Sig Start Date End Date Taking? Authorizing Provider  ALPRAZolam Prudy Feeler) 1 MG tablet TAKE 1 TABLET(1 MG) BY MOUTH AT BEDTIME AS NEEDED FOR ANXIETY 02/04/22  Yes Reubin Milan, MD  Ascorbic Acid (VITAMIN C) 1000 MG tablet Take 1,000 mg by mouth daily.   Yes [provider]  atorvastatin (LIPITOR) 10 MG tablet TAKE 1 TABLET(10 MG) BY MOUTH DAILY 05/09/22  Yes Reubin Milan, MD  cholecalciferol (VITAMIN D3) 25 MCG (1000 UNIT) tablet Take 1,000 Units by mouth daily.   Yes [provider]  meloxicam (MOBIC) 15 MG tablet TAKE 1 TABLET(15 MG) BY MOUTH DAILY AS NEEDED 04/23/21  Yes Reubin Milan, MD  PROAIR HFA 108 [phone] Base) MCG/ACT inhaler INL 2 INHALATIONS ITL Q 6 H PRF WHZ 04/11/16  Yes [provider]  SPIRIVA HANDIHALER 18 MCG inhalation capsule  04/18/16  Yes [provider]  vitamin E 1000 UNIT capsule Take 1,000 Units by mouth daily.   Yes [provider]  nystatin cream (MYCOSTATIN) Apply 1 application topically 2 (two) times daily. 05/01/20   Reubin Milan, MD    Allergies as of 03/22/2022   (No Known Allergies)    Family History  Problem Relation Age of Onset   Alzheimer's disease Mother    Heart disease Father    Breast cancer Neg Hx     Social History   Socioeconomic History   Marital status: Single    Spouse name: Not on file   Number of children: Not on file   Years of education: Not on file   Highest education level: Not on file  Occupational History   Not on file  Tobacco Use   Smoking status: Former    Packs/day: 2.00    Years: 30.00    Additional pack years: 0.00    Total pack years: 60.00    Types: Cigarettes    Quit date: 02/18/2014    Years since quitting: 8.3   Smokeless tobacco: Never  Vaping  Use   Vaping Use: Never used  Substance and Sexual Activity   Alcohol use: Yes    Alcohol/week: 0.0 standard drinks of alcohol    Comment: occasional   Drug use: No   Sexual activity: Not on file  Other Topics Concern   Not on file  Social History Narrative   Not on file   Social Determinants of Health   Financial Resource Strain: Not on file  Food Insecurity: Not on file  Transportation Needs: Not on file  Physical Activity: Not on file  Stress: Not on file  Social Connections: Not on file  Intimate Partner Violence: Not on file    Review of Systems: See HPI, otherwise negative ROS  Physical Exam: BP 132/80   Pulse 60   Temp (!) 97.2 F (36.2 C) (Temporal)   Ht 5\' 6"  (1.676 m)    Wt 64.9 kg   SpO2 96%   BMI 23.08 kg/m  General:   Alert,  pleasant and cooperative in NAD Head:  Normocephalic and atraumatic. Neck:  Supple; no masses or thyromegaly. Lungs:  Clear throughout to auscultation.    Heart:  Regular rate and rhythm. Abdomen:  Soft, nontender and nondistended. Normal bowel sounds, without guarding, and without rebound.   Neurologic:  Alert and  oriented x4;  grossly normal neurologically.  Impression/Plan: Michie Luallen is here for an colonoscopy to be performed for a history of adenomatous polyps on 2021   Risks, benefits, limitations, and alternatives regarding  colonoscopy have been reviewed with the patient.  Questions have been answered.  All parties agreeable.   Denise Minium, MD  06/24/2022, 7:38 AM

## 2022-06-25 ENCOUNTER — Encounter: Payer: Self-pay | Admitting: Gastroenterology

## 2022-06-26 ENCOUNTER — Encounter: Payer: Self-pay | Admitting: Gastroenterology

## 2022-06-26 LAB — SURGICAL PATHOLOGY

## 2022-07-11 ENCOUNTER — Other Ambulatory Visit: Payer: Self-pay | Admitting: Internal Medicine

## 2022-07-11 DIAGNOSIS — F411 Generalized anxiety disorder: Secondary | ICD-10-CM

## 2022-07-11 NOTE — Telephone Encounter (Signed)
Please review.  KP

## 2022-07-11 NOTE — Telephone Encounter (Signed)
Medication Refill - Medication: ALPRAZolam (XANAX) 1 MG tablet Pt states that she is out of medication.  Has the patient contacted their pharmacy? No.  Preferred Pharmacy (with phone number or street name): [id number] - [place], [place] - [address] Phone: [phone] Fax: [phone]     Has the patient been seen for an appointment in the last year OR does the patient have an upcoming appointment? Yes  Agent: Please be advised that RX refills may take up to 3 business days.

## 2022-07-11 NOTE — Telephone Encounter (Signed)
Requested medication (s) are due for refill today:   Provider to review  Requested medication (s) are on the active medication list:   Yes  Future visit scheduled:   Yes 05/13/2023    Last ordered: 02/04/2022 #30, 1 refill  Returned because it's a non delegated refill    Requested Prescriptions  Pending Prescriptions Disp Refills   ALPRAZolam (XANAX) 1 MG tablet 30 tablet 1     Not Delegated - Psychiatry: Anxiolytics/Hypnotics 2 Failed - 07/11/2022 12:37 PM      Failed - This refill cannot be delegated      Failed - Urine Drug Screen completed in last 360 days      Passed - Patient is not pregnant      Passed - Valid encounter within last 6 months    Recent Outpatient Visits           2 months ago Annual physical exam   [place] [facility] at [facility], Nyoka Cowden, MD   8 months ago Stage 2 moderate COPD by GOLD classification [facility])   [place] [facility] at [facility], Nyoka Cowden, MD   1 year ago Annual physical exam   [facility] at [facility], Nyoka Cowden, MD   2 years ago Annual physical exam   [facility] at [facility], Nyoka Cowden, MD   2 years ago Acute non-recurrent maxillary sinusitis   [facility] at [facility], Nyoka Cowden, MD       Future Appointments             In 10 months Judithann Graves Nyoka Cowden, MD [facility] & [facility] Medicine at [facility], [facility]

## 2022-07-12 MED ORDER — ALPRAZOLAM 1 MG PO TABS: ORAL_TABLET | ORAL | 1 refills | Status: DC

## 2022-07-23 ENCOUNTER — Ambulatory Visit
Admission: RE | Admit: 2022-07-23 | Discharge: 2022-07-23 | Disposition: A | Discharge status: Home / Self Care | Payer: 59 | Source: Ambulatory Visit | Attending: Internal Medicine | Admitting: Internal Medicine

## 2022-07-23 DIAGNOSIS — Z1231 Encounter for screening mammogram for malignant neoplasm of breast: Secondary | ICD-10-CM | POA: Diagnosis present

## 2022-08-14 ENCOUNTER — Ambulatory Visit: Payer: 59

## 2022-10-26 ENCOUNTER — Other Ambulatory Visit: Payer: Self-pay | Admitting: Internal Medicine

## 2022-10-26 DIAGNOSIS — I700 Atherosclerosis of aorta: Secondary | ICD-10-CM

## 2023-01-19 ENCOUNTER — Other Ambulatory Visit: Payer: Self-pay | Admitting: Internal Medicine

## 2023-01-19 DIAGNOSIS — F411 Generalized anxiety disorder: Secondary | ICD-10-CM

## 2023-01-20 NOTE — Telephone Encounter (Signed)
Requested medication (s) are due for refill today - yes  Requested medication (s) are on the active medication list -yes  Future visit scheduled -yes  Last refill: 07/12/22 #30 1RF  Notes to clinic: non delegated Rx  Requested Prescriptions  Pending Prescriptions Disp Refills   ALPRAZolam (XANAX) 1 MG tablet [Pharmacy Med Name: ALPRAZOLAM 1 MG TABLET] 30 tablet     Sig: TAKE 1 TABLET(1 MG) BY MOUTH AT BEDTIME AS NEEDED FOR ANXIETY     Not Delegated - Psychiatry: Anxiolytics/Hypnotics 2 Failed - 01/19/2023  4:34 PM      Failed - This refill cannot be delegated      Failed - Urine Drug Screen completed in last 360 days      Failed - Valid encounter within last 6 months    Recent Outpatient Visits           8 months ago Annual physical exam   [place] [facility] at [facility], Nyoka Cowden, MD   1 year ago Stage 2 moderate COPD by GOLD classification [facility])   [place] [facility] at [facility], Nyoka Cowden, MD   1 year ago Annual physical exam   [facility] at [facility], Nyoka Cowden, MD   2 years ago Annual physical exam   [facility] at [facility], Nyoka Cowden, MD   3 years ago Acute non-recurrent maxillary sinusitis   [place] [facility] at [facility], Nyoka Cowden, MD       Future Appointments             In 3 months Judithann Graves Nyoka Cowden, MD [facility] Primary [facility] & Sports Medicine at [facility], [facility]            Passed - Patient is not pregnant         Requested Prescriptions  Pending Prescriptions Disp Refills   ALPRAZolam (XANAX) 1 MG tablet [Pharmacy Med Name: ALPRAZOLAM 1 MG TABLET] 30 tablet     Sig: TAKE 1 TABLET(1 MG) BY MOUTH AT BEDTIME AS NEEDED FOR ANXIETY     Not Delegated - Psychiatry: Anxiolytics/Hypnotics 2 Failed - 01/19/2023  4:34 PM      Failed - This refill  cannot be delegated      Failed - Urine Drug Screen completed in last 360 days      Failed - Valid encounter within last 6 months    Recent Outpatient Visits           8 months ago Annual physical exam   [place] [facility] at [facility], Nyoka Cowden, MD   1 year ago Stage 2 moderate COPD by GOLD classification [facility])   [place] [facility] at [facility], Nyoka Cowden, MD   1 year ago Annual physical exam   [facility] at [facility], Nyoka Cowden, MD   2 years ago Annual physical exam   [facility] at [facility], Nyoka Cowden, MD   3 years ago Acute non-recurrent maxillary sinusitis   [facility] Medicine at [facility], Nyoka Cowden, MD       Future Appointments             In 3 months Judithann Graves,  Nyoka Cowden, MD [facility] Primary Care & Sports Medicine at [facility], [facility]            Passed - Patient is not pregnant

## 2023-04-26 ENCOUNTER — Other Ambulatory Visit: Payer: Self-pay | Admitting: Internal Medicine

## 2023-04-26 DIAGNOSIS — I700 Atherosclerosis of aorta: Secondary | ICD-10-CM

## 2023-04-28 NOTE — Telephone Encounter (Signed)
 Requested Prescriptions  Pending Prescriptions Disp Refills   atorvastatin (LIPITOR) 10 MG tablet [Pharmacy Med Name: ATORVASTATIN 10 MG TABLET] 90 tablet 0    Sig: TAKE 1 TABLET BY MOUTH EVERY DAY     Cardiovascular:  Antilipid - Statins Failed - 04/28/2023  2:03 PM      Failed - Lipid Panel in normal range within the last 12 months    Cholesterol, Total  Date Value Ref Range Status  05/09/2022 130 100 - 199 mg/dL Final   LDL Chol Calc (NIH)  Date Value Ref Range Status  05/09/2022 42 0 - 99 mg/dL Final   HDL  Date Value Ref Range Status  05/09/2022 75 >39 mg/dL Final   Triglycerides  Date Value Ref Range Status  05/09/2022 62 0 - 149 mg/dL Final         Passed - Patient is not pregnant      Passed - Valid encounter within last 12 months    Recent Outpatient Visits           11 months ago Annual physical exam   [place] [facility] at [facility], Nyoka Cowden, MD   1 year ago Stage 2 moderate COPD by GOLD classification [facility])   [place] [facility] at [facility], Nyoka Cowden, MD   1 year ago Annual physical exam   [facility] at [facility], Nyoka Cowden, MD   2 years ago Annual physical exam   [facility] Sports Medicine at [facility], Nyoka Cowden, MD   3 years ago Acute non-recurrent maxillary sinusitis   [place] [facility] at [facility], Nyoka Cowden, MD       Future Appointments             In 2 weeks Judithann Graves, Nyoka Cowden, MD [facility] Primary Care & Sports Medicine at [facility], [facility]

## 2023-05-13 ENCOUNTER — Encounter: Payer: Self-pay | Admitting: Internal Medicine

## 2023-05-13 ENCOUNTER — Ambulatory Visit (INDEPENDENT_AMBULATORY_CARE_PROVIDER_SITE_OTHER): Payer: Commercial Managed Care - HMO | Admitting: Internal Medicine

## 2023-05-13 ENCOUNTER — Other Ambulatory Visit: Payer: Self-pay

## 2023-05-13 VITALS — BP 116/70 | HR 71 | Ht 66.0 in | Wt 155.0 lb

## 2023-05-13 DIAGNOSIS — Z122 Encounter for screening for malignant neoplasm of respiratory organs: Secondary | ICD-10-CM

## 2023-05-13 DIAGNOSIS — J449 Chronic obstructive pulmonary disease, unspecified: Secondary | ICD-10-CM | POA: Diagnosis not present

## 2023-05-13 DIAGNOSIS — F411 Generalized anxiety disorder: Secondary | ICD-10-CM

## 2023-05-13 DIAGNOSIS — I700 Atherosclerosis of aorta: Secondary | ICD-10-CM

## 2023-05-13 DIAGNOSIS — Z0000 Encounter for general adult medical examination without abnormal findings: Secondary | ICD-10-CM

## 2023-05-13 DIAGNOSIS — F17201 Nicotine dependence, unspecified, in remission: Secondary | ICD-10-CM

## 2023-05-13 DIAGNOSIS — Z1231 Encounter for screening mammogram for malignant neoplasm of breast: Secondary | ICD-10-CM | POA: Diagnosis not present

## 2023-05-13 DIAGNOSIS — B354 Tinea corporis: Secondary | ICD-10-CM | POA: Diagnosis not present

## 2023-05-13 DIAGNOSIS — Z87891 Personal history of nicotine dependence: Secondary | ICD-10-CM

## 2023-05-13 MED ORDER — ALPRAZOLAM 1 MG PO TABS
ORAL_TABLET | ORAL | 0 refills | Status: DC
Start: 2023-05-13 — End: 2023-08-14

## 2023-05-13 MED ORDER — MELOXICAM 15 MG PO TABS: 15.0000 mg | ORAL_TABLET | Freq: Every day | ORAL | 1 refills | Status: AC

## 2023-05-13 MED ORDER — NYSTATIN 100000 UNIT/GM EX CREA: 1.0000 | TOPICAL_CREAM | Freq: Two times a day (BID) | CUTANEOUS | 1 refills | Status: DC

## 2023-05-13 NOTE — Assessment & Plan Note (Addendum)
 Participating in lung cancer screening but last done 2023

## 2023-05-13 NOTE — Progress Notes (Signed)
 Date:  05/13/2023   Name:  Denise Fernandez   DOB:  03/01/1960   MRN:  [id number]   Chief Complaint: Annual Exam Denise Fernandez is a 63 y.o. female who presents today for her Complete Annual Exam. Denise Fernandez feels well. Denise Fernandez reports exercising walks, 7 days a week, 30 minutes or more. Denise Fernandez reports Denise Fernandez is sleeping poorly. Breast complaints none. Her weight loss has slowed and is now stable.    Health Maintenance  Topic Date Due   HIV Screening  Never done   Screening for Lung Cancer  08/14/2022   COVID-19 Vaccine (1 - 2024-25 season) 05/29/2023*   DTaP/Tdap/Td vaccine (1 - Tdap) 05/12/2024*   Mammogram  07/23/2023   Colon Cancer Screening  06/23/2029   Pneumococcal Vaccination  Completed   Flu Shot  Completed   Hepatitis C Screening  Completed   Zoster (Shingles) Vaccine  Completed   HPV Vaccine  Aged Out  *Topic was postponed. The date shown is not the original due date.    Hyperlipidemia This is a chronic problem. The problem is controlled. Pertinent negatives include no chest pain, myalgias or shortness of breath. Current antihyperlipidemic treatment includes statins.    Review of Systems  Constitutional:  Negative for fatigue and unexpected weight change.  HENT:  Negative for trouble swallowing.   Eyes:  Negative for visual disturbance.  Respiratory:  Negative for cough, chest tightness, shortness of breath and wheezing.   Cardiovascular:  Negative for chest pain, palpitations and leg swelling.  Gastrointestinal:  Negative for abdominal pain, constipation and diarrhea.  Musculoskeletal:  Negative for arthralgias and myalgias.  Neurological:  Negative for dizziness, weakness, light-headedness and headaches.     Lab Results  Component Value Date   NA 141 05/09/2022   K 4.6 05/09/2022   CO2 23 05/09/2022   GLUCOSE 95 05/09/2022   BUN 16 05/09/2022   CREATININE 0.68 05/09/2022   CALCIUM 10.1 05/09/2022   EGFR 100 05/09/2022   GFRNONAA 85 04/27/2019   Lab Results   Component Value Date   CHOL 130 05/09/2022   HDL 75 05/09/2022   LDLCALC 42 05/09/2022   TRIG 62 05/09/2022   CHOLHDL 1.7 05/09/2022   Lab Results  Component Value Date   TSH 1.490 05/09/2022   No results found for: "HGBA1C" Lab Results  Component Value Date   WBC 4.8 05/09/2022   HGB 14.6 05/09/2022   HCT 42.6 05/09/2022   MCV 93 05/09/2022   PLT 281 05/09/2022   Lab Results  Component Value Date   ALT 25 05/09/2022   AST 18 05/09/2022   ALKPHOS 101 05/09/2022   BILITOT 0.4 05/09/2022   No results found for: "25OHVITD2", "25OHVITD3", "VD25OH"   Patient Active Problem List   Diagnosis Date Noted   S/P total hysterectomy 04/30/2020   Adenomatous colon polyp    Tobacco use disorder, moderate, in sustained remission 04/23/2018   Atherosclerosis of aorta (HCC) 04/23/2018   Stage 2 moderate COPD by GOLD classification (HCC) 04/22/2017   Generalized anxiety disorder 01/18/2015   Fibrocystic breast disease (FCBD) 01/10/2015    No Known Allergies  Past Surgical History:  Procedure Laterality Date   COLONOSCOPY WITH PROPOFOL N/A 05/31/2019   Procedure: COLONOSCOPY WITH BIOPSY;  Surgeon: Midge Minium, MD;  [facility];  Service: Endoscopy;  Laterality: N/A;  priority 4 wants early as possible   COLONOSCOPY WITH PROPOFOL N/A 06/24/2022   Procedure: COLONOSCOPY WITH BIOPSY;  Surgeon: Midge Minium, MD;  Location:  [facility];  Service: Endoscopy;  Laterality: N/A;   NEPHROLITHOTOMY  2012   Dr. Ova Freshwater   POLYPECTOMY N/A 05/31/2019   Procedure: POLYPECTOMY;  Surgeon: Midge Minium, MD;  Location: [facility];  Service: Endoscopy;  Laterality: N/A;  Ascending Colon Polyp removal site closed with 2 Clips   POLYPECTOMY  06/24/2022   Procedure: POLYPECTOMY;  Surgeon: Midge Minium, MD;  Location: [facility];  Service: Endoscopy;;   TONSILLECTOMY     TOTAL ABDOMINAL HYSTERECTOMY  2001   Ovaries remain.    Social History    Tobacco Use   Smoking status: Former    Current packs/day: 0.00    Average packs/day: 2.0 packs/day for 30.0 years (60.0 ttl pk-yrs)    Types: Cigarettes    Start date: 02/19/1984    Quit date: 02/18/2014    Years since quitting: 9.2   Smokeless tobacco: Never  Vaping Use   Vaping status: Never Used  Substance Use Topics   Alcohol use: Yes    Alcohol/week: 0.0 standard drinks of alcohol    Comment: occasional   Drug use: No     Medication list has been reviewed and updated.  Current Meds  Medication Sig   Ascorbic Acid (VITAMIN C) 1000 MG tablet Take 1,000 mg by mouth daily.   atorvastatin (LIPITOR) 10 MG tablet TAKE 1 TABLET BY MOUTH EVERY DAY   cholecalciferol (VITAMIN D3) 25 MCG (1000 UNIT) tablet Take 1,000 Units by mouth daily.   Multiple Vitamins-Minerals (ZINC PO) Take by mouth daily.   PROAIR HFA 108 (90 Base) MCG/ACT inhaler INL 2 INHALATIONS ITL Q 6 H PRF WHZ   SPIRIVA HANDIHALER 18 MCG inhalation capsule    vitamin E 1000 UNIT capsule Take 1,000 Units by mouth daily.   [DISCONTINUED] ALPRAZolam (XANAX) 1 MG tablet TAKE 1 TABLET(1 MG) BY MOUTH AT BEDTIME AS NEEDED FOR ANXIETY   [DISCONTINUED] meloxicam (MOBIC) 15 MG tablet TAKE 1 TABLET(15 MG) BY MOUTH DAILY AS NEEDED   [DISCONTINUED] nystatin cream (MYCOSTATIN) Apply 1 application topically 2 (two) times daily.       05/13/2023    8:05 AM 05/09/2022    8:10 AM 11/06/2021    8:15 AM 05/03/2021    8:13 AM  GAD 7 : Generalized Anxiety Score  Nervous, Anxious, on Edge 1 1 1 1   Control/stop worrying 3 1 1 2   Worry too much - different things 3 1 1 1   Trouble relaxing 1 0 0 1  Restless 0 0 0 0  Easily annoyed or irritable 1 1 0 1  Afraid - awful might happen 1 1 0 1  Total GAD 7 Score 10 5 3 7   Anxiety Difficulty Not difficult at all Not difficult at all Not difficult at all Somewhat difficult       05/13/2023    8:05 AM 05/09/2022    8:10 AM 11/06/2021    8:15 AM  Depression screen PHQ 2/9  Decreased Interest  0 0 1  Down, Depressed, Hopeless 0 0 0  PHQ - 2 Score 0 0 1  Altered sleeping 2 1 1   Tired, decreased energy 2 0 0  Change in appetite 0 0 0  Feeling bad or failure about yourself  0 0 0  Trouble concentrating 0 0 1  Moving slowly or fidgety/restless 0 0 0  Suicidal thoughts 0 0 0  PHQ-9 Score 4 1 3   Difficult doing work/chores Not difficult at all Not difficult at all Not difficult at  all    BP Readings from Last 3 Encounters:  05/13/23 116/70  06/24/22 115/68  05/09/22 134/68    Physical Exam Vitals and nursing note reviewed.  Constitutional:      General: Denise Fernandez is not in acute distress.    Appearance: Denise Fernandez is well-developed.  HENT:     Head: Normocephalic and atraumatic.     Right Ear: Tympanic membrane and ear canal normal.     Left Ear: Tympanic membrane and ear canal normal.     Nose:     Right Sinus: No maxillary sinus tenderness.     Left Sinus: No maxillary sinus tenderness.  Eyes:     General: No scleral icterus.       Right eye: No discharge.        Left eye: No discharge.     Conjunctiva/sclera: Conjunctivae normal.  Neck:     Thyroid: No thyromegaly.     Vascular: No carotid bruit.  Cardiovascular:     Rate and Rhythm: Normal rate and regular rhythm.     Pulses: Normal pulses.     Heart sounds: Normal heart sounds.  Pulmonary:     Effort: Pulmonary effort is normal. No respiratory distress.     Breath sounds: No wheezing.  Abdominal:     General: Bowel sounds are normal.     Palpations: Abdomen is soft.     Tenderness: There is no abdominal tenderness.  Musculoskeletal:     Cervical back: Normal range of motion. No erythema.     Right lower leg: No edema.     Left lower leg: No edema.  Lymphadenopathy:     Cervical: No cervical adenopathy.  Skin:    General: Skin is warm and dry.     Findings: No rash.  Neurological:     Mental Status: Denise Fernandez is alert and oriented to person, place, and time.     Cranial Nerves: No cranial nerve deficit.      Sensory: No sensory deficit.     Deep Tendon Reflexes: Reflexes are normal and symmetric.  Psychiatric:        Attention and Perception: Attention normal.        Mood and Affect: Mood normal.     Wt Readings from Last 3 Encounters:  05/13/23 155 lb (70.3 kg)  06/24/22 143 lb (64.9 kg)  05/09/22 144 lb 3.2 oz (65.4 kg)    BP 116/70   Pulse 71   Ht 5\' 6"  (1.676 m)   Wt 155 lb (70.3 kg)   SpO2 98%   BMI 25.02 kg/m   Assessment and Plan:  Problem List Items Addressed This Visit       Unprioritized   Generalized anxiety disorder (Chronic)   Mild intermittent symptoms  Takes xanax at bedtime to aid sleep      Relevant Medications   ALPRAZolam (XANAX) 1 MG tablet   Other Relevant Orders   TSH   Stage 2 moderate COPD by GOLD classification (HCC) (Chronic)   Symptoms controlled with Spiriva and PRN albuterol.      Relevant Orders   CBC with Differential/Platelet   Tobacco use disorder, moderate, in sustained remission   Participating in lung cancer screening but last done 2023      Relevant Orders   Ambulatory Referral Lung Cancer Screening [facility]   Atherosclerosis of aorta (HCC) (Chronic)   LDL is  Lab Results  Component Value Date   LDLCALC 42 05/09/2022   Current regimen is atorvastatin.  No medication side effects noted. Goal LDL is <55.       Relevant Orders   Comprehensive metabolic panel   Lipid panel   Other Visit Diagnoses       Annual physical exam    -  Primary   continue healthy diet and exercise   Relevant Orders   CBC with Differential/Platelet   Comprehensive metabolic panel   Lipid panel   TSH     Encounter for screening mammogram for breast cancer       Relevant Orders   MM 3D SCREENING MAMMOGRAM BILATERAL BREAST     Tinea corporis       Relevant Medications   nystatin cream (MYCOSTATIN)       No follow-ups on file.    Reubin Milan, MD [facility] [place]

## 2023-05-13 NOTE — Assessment & Plan Note (Signed)
 Symptoms controlled with Spiriva and PRN albuterol.

## 2023-05-13 NOTE — Assessment & Plan Note (Signed)
 Mild intermittent symptoms  Takes xanax at bedtime to aid sleep

## 2023-05-13 NOTE — Patient Instructions (Signed)
 Call [facility] to schedule your mammogram at [phone].

## 2023-05-13 NOTE — Assessment & Plan Note (Signed)
 LDL is  Lab Results  Component Value Date   LDLCALC 42 05/09/2022   Current regimen is atorvastatin.  No medication side effects noted. Goal LDL is <55.

## 2023-05-14 ENCOUNTER — Encounter: Payer: Self-pay | Admitting: Internal Medicine

## 2023-05-14 LAB — LIPID PANEL
Chol/HDL Ratio: 1.9 ratio (ref 0.0–4.4)
Cholesterol, Total: 115 mg/dL (ref 100–199)
HDL: 62 mg/dL (ref >39)
LDL Chol Calc (NIH): 38 mg/dL (ref 0–99)
Triglycerides: 73 mg/dL (ref 0–149)
VLDL Cholesterol Cal: 15 mg/dL (ref 5–40)

## 2023-05-14 LAB — COMPREHENSIVE METABOLIC PANEL
ALT: 19 IU/L (ref 0–32)
AST: 17 IU/L (ref 0–40)
Albumin: 4.8 g/dL (ref 3.9–4.9)
Alkaline Phosphatase: 110 IU/L (ref 44–121)
BUN/Creatinine Ratio: 24 (ref 12–28)
BUN: 17 mg/dL (ref 8–27)
Bilirubin Total: 0.3 mg/dL (ref 0.0–1.2)
CO2: 23 mmol/L (ref 20–29)
Calcium: 9.4 mg/dL (ref 8.7–10.3)
Chloride: 103 mmol/L (ref 96–106)
Creatinine, Ser: 0.7 mg/dL (ref 0.57–1.00)
Globulin, Total: 2.2 g/dL (ref 1.5–4.5)
Glucose: 89 mg/dL (ref 70–99)
Potassium: 4.4 mmol/L (ref 3.5–5.2)
Sodium: 141 mmol/L (ref 134–144)
Total Protein: 7 g/dL (ref 6.0–8.5)
eGFR: 98 mL/min/{1.73_m2} (ref >59)

## 2023-05-14 LAB — CBC WITH DIFFERENTIAL/PLATELET
Basophils Absolute: 0 10*3/uL (ref 0.0–0.2)
Basos: 1 %
EOS (ABSOLUTE): 0.1 10*3/uL (ref 0.0–0.4)
Eos: 2 %
Hematocrit: 44 % (ref 34.0–46.6)
Hemoglobin: 14.7 g/dL (ref 11.1–15.9)
Immature Grans (Abs): 0 10*3/uL (ref 0.0–0.1)
Immature Granulocytes: 0 %
Lymphocytes Absolute: 2 10*3/uL (ref 0.7–3.1)
Lymphs: 38 %
MCH: 32.3 pg (ref 26.6–33.0)
MCHC: 33.4 g/dL (ref 31.5–35.7)
MCV: 97 fL (ref 79–97)
Monocytes Absolute: 0.4 10*3/uL (ref 0.1–0.9)
Monocytes: 8 %
Neutrophils Absolute: 2.7 10*3/uL (ref 1.4–7.0)
Neutrophils: 51 %
Platelets: 290 10*3/uL (ref 150–450)
RBC: 4.55 x10E6/uL (ref 3.77–5.28)
RDW: 11.7 % (ref 11.7–15.4)
WBC: 5.2 10*3/uL (ref 3.4–10.8)

## 2023-05-14 LAB — TSH: TSH: 1.54 u[IU]/mL (ref 0.450–4.500)

## 2023-06-04 ENCOUNTER — Encounter: Payer: Self-pay | Admitting: Acute Care

## 2023-06-24 ENCOUNTER — Ambulatory Visit
Admission: RE | Admit: 2023-06-24 | Discharge: 2023-06-24 | Disposition: A | Discharge status: Home / Self Care | Source: Ambulatory Visit | Attending: Acute Care | Admitting: Acute Care

## 2023-06-24 DIAGNOSIS — Z122 Encounter for screening for malignant neoplasm of respiratory organs: Secondary | ICD-10-CM

## 2023-06-24 DIAGNOSIS — Z87891 Personal history of nicotine dependence: Secondary | ICD-10-CM

## 2023-07-18 ENCOUNTER — Other Ambulatory Visit: Payer: Self-pay

## 2023-07-18 DIAGNOSIS — Z87891 Personal history of nicotine dependence: Secondary | ICD-10-CM

## 2023-07-18 DIAGNOSIS — Z122 Encounter for screening for malignant neoplasm of respiratory organs: Secondary | ICD-10-CM

## 2023-07-18 DIAGNOSIS — F17210 Nicotine dependence, cigarettes, uncomplicated: Secondary | ICD-10-CM

## 2023-07-29 ENCOUNTER — Ambulatory Visit
Admission: RE | Admit: 2023-07-29 | Discharge: 2023-07-29 | Disposition: A | Discharge status: Home / Self Care | Source: Ambulatory Visit | Attending: Internal Medicine | Admitting: Internal Medicine

## 2023-07-29 DIAGNOSIS — Z1231 Encounter for screening mammogram for malignant neoplasm of breast: Secondary | ICD-10-CM | POA: Insufficient documentation

## 2023-08-08 ENCOUNTER — Other Ambulatory Visit: Payer: Self-pay | Admitting: Internal Medicine

## 2023-08-08 DIAGNOSIS — I700 Atherosclerosis of aorta: Secondary | ICD-10-CM

## 2023-08-11 NOTE — Telephone Encounter (Signed)
 Requested Prescriptions  Pending Prescriptions Disp Refills   atorvastatin  (LIPITOR) 10 MG tablet [Pharmacy Med Name: ATORVASTATIN  10 MG TABLET] 30 tablet 2    Sig: TAKE 1 TABLET BY MOUTH EVERY DAY     Cardiovascular:  Antilipid - Statins Failed - 08/11/2023  3:20 PM      Failed - Lipid Panel in normal range within the last 12 months    Cholesterol, Total  Date Value Ref Range Status  05/13/2023 115 100 - 199 mg/dL Final   LDL Chol Calc (NIH)  Date Value Ref Range Status  05/13/2023 38 0 - 99 mg/dL Final   HDL  Date Value Ref Range Status  05/13/2023 62 >39 mg/dL Final   Triglycerides  Date Value Ref Range Status  05/13/2023 73 0 - 149 mg/dL Final         Passed - Patient is not pregnant      Passed - Valid encounter within last 12 months    Recent Outpatient Visits           3 months ago Annual physical exam   [place] [facility] at [facility], Leita DEL, MD       Future Appointments             In 9 months Lemon Raisin, MD [facility] at [facility], [facility]

## 2023-08-12 ENCOUNTER — Other Ambulatory Visit: Payer: Self-pay | Admitting: Internal Medicine

## 2023-08-12 DIAGNOSIS — F411 Generalized anxiety disorder: Secondary | ICD-10-CM

## 2023-08-14 NOTE — Telephone Encounter (Signed)
 Requested medication (s) are due for refill today: yes  Requested medication (s) are on the active medication list: yes  Last refill:  05/13/23  Future visit scheduled: yes  Notes to clinic:  Unable to refill per protocol, cannot delegate.      Requested Prescriptions  Pending Prescriptions Disp Refills   ALPRAZolam  (XANAX ) 1 MG tablet [Pharmacy Med Name: ALPRAZOLAM  1 MG TABLET] 30 tablet 0    Sig: TAKE 1 TABLET(1 MG) BY MOUTH AT BEDTIME AS NEEDED FOR ANXIETY     Not Delegated - Psychiatry: Anxiolytics/Hypnotics 2 Failed - 08/14/2023  9:33 AM      Failed - This refill cannot be delegated      Failed - Urine Drug Screen completed in last 360 days      Passed - Patient is not pregnant      Passed - Valid encounter within last 6 months    Recent Outpatient Visits           3 months ago Annual physical exam   [facility] at [facility], Leita DEL, MD       Future Appointments             In 9 months Lemon Raisin, MD [facility] at [facility], [facility]

## 2023-08-14 NOTE — Telephone Encounter (Signed)
 Please review.  KP

## 2023-12-10 ENCOUNTER — Other Ambulatory Visit: Payer: Self-pay | Admitting: Internal Medicine

## 2023-12-10 DIAGNOSIS — F411 Generalized anxiety disorder: Secondary | ICD-10-CM

## 2023-12-12 NOTE — Telephone Encounter (Signed)
 Requested medications are due for refill today.  yes  Requested medications are on the active medications list.  yes  Last refill. 08/14/2023 #30 0 rf  Future visit scheduled.   yes  Notes to clinic.  Refill not delegated.    Requested Prescriptions  Pending Prescriptions Disp Refills   ALPRAZolam  (XANAX ) 1 MG tablet [Pharmacy Med Name: ALPRAZOLAM  1 MG TABLET] 30 tablet 0    Sig: TAKE 1 TABLET(1 MG) BY MOUTH AT BEDTIME AS NEEDED FOR ANXIETY     Not Delegated - Psychiatry: Anxiolytics/Hypnotics 2 Failed - 12/12/2023 11:08 AM      Failed - This refill cannot be delegated      Failed - Urine Drug Screen completed in last 360 days      Failed - Valid encounter within last 6 months    Recent Outpatient Visits           7 months ago Annual physical exam   [facility] at [facility], Leita DEL, MD       Future Appointments             In 5 months Lemon Raisin, MD [facility] at [facility], [phone] [place]            Passed - Patient is not pregnant

## 2023-12-15 ENCOUNTER — Telehealth: Payer: Self-pay

## 2023-12-15 NOTE — Telephone Encounter (Signed)
 Please review.  KP

## 2023-12-15 NOTE — Telephone Encounter (Signed)
 Copied from CRM [id number]. Topic: Clinical - Prescription Issue >> Dec 12, 2023  3:36 PM Winona R wrote: Pharmacy requested RX refill for ALPRAZolam  (XANAX ) 1 MG tablet [[id number]] on 10/22 and have not heard anything back .

## 2024-03-25 ENCOUNTER — Other Ambulatory Visit: Payer: Self-pay | Admitting: Internal Medicine

## 2024-03-25 DIAGNOSIS — B354 Tinea corporis: Secondary | ICD-10-CM

## 2024-03-25 DIAGNOSIS — F411 Generalized anxiety disorder: Secondary | ICD-10-CM

## 2024-03-25 NOTE — Telephone Encounter (Unsigned)
 Copied from CRM [id number]. Topic: Clinical - Medication Refill >> Mar 25, 2024 10:05 AM Alfonso HERO wrote: Medication: ALPRAZolam  (XANAX ) 1 MG tablet nystatin  cream (MYCOSTATIN )  Has the patient contacted their pharmacy? Yes (Agent: If no, request that the patient contact the pharmacy for the refill. If patient does not wish to contact the pharmacy document the reason why and proceed with request.) (Agent: If yes, when and what did the pharmacy advise?)  This is the patient's preferred pharmacy:   [id number] - [place], [place] - [address] [place] [address] Phone: [phone] Fax: [phone]  Is this the correct pharmacy for this prescription? Yes If no, delete pharmacy and type the correct one.   Has the prescription been filled recently? Yes  Is the patient out of the medication? No  Has the patient been seen for an appointment in the last year OR does the patient have an upcoming appointment? Yes  Can we respond through MyChart? Yes  Agent: Please be advised that Rx refills may take up to 3 business days. We ask that you follow-up with your pharmacy.

## 2024-03-26 MED ORDER — ALPRAZOLAM 1 MG PO TABS: ORAL_TABLET | ORAL | 0 refills | Status: AC

## 2024-03-26 MED ORDER — NYSTATIN 100000 UNIT/GM EX CREA: 1.0000 | TOPICAL_CREAM | Freq: Two times a day (BID) | CUTANEOUS | 1 refills | Status: AC

## 2024-03-26 NOTE — Telephone Encounter (Signed)
 Please review.  KP

## 2024-03-26 NOTE — Telephone Encounter (Signed)
 Requested medication (s) are due for refill today: yes  Requested medication (s) are on the active medication list: yes  Last refill:  alprazolam : 12/15/23 #30 tablets      Nystatin  05/13/23 30 g 1 RF  Future visit scheduled: yes   Notes to clinic:  Alprazolam : med not delegated to NT to RF// Nystatin  not assigned to a protocol   Requested Prescriptions  Pending Prescriptions Disp Refills   ALPRAZolam  (XANAX ) 1 MG tablet 30 tablet 0    Sig: TAKE 1 TABLET(1 MG) BY MOUTH AT BEDTIME AS NEEDED FOR ANXIETY     Not Delegated - Psychiatry: Anxiolytics/Hypnotics 2 Failed - 03/26/2024  2:36 PM      Failed - This refill cannot be delegated      Failed - Urine Drug Screen completed in last 360 days      Failed - Valid encounter within last 6 months    Recent Outpatient Visits           10 months ago Annual physical exam   [place] [facility] at [facility], Leita DEL, MD       Future Appointments             In 1 month Lemon Raisin, MD [facility] at [facility], [phone] [place]            Passed - Patient is not pregnant       nystatin  cream (MYCOSTATIN ) 30 g 1    Sig: Apply 1 Application topically 2 (two) times daily.     Off-Protocol Failed - 03/26/2024  2:36 PM      Failed - Medication not assigned to a protocol, review manually.      Passed - Valid encounter within last 12 months    Recent Outpatient Visits           10 months ago Annual physical exam   [facility] at [facility], Leita DEL, MD       Future Appointments             In 1 month Lemon Raisin, MD [facility] at [facility], [phone] [place]

## 2024-05-18 ENCOUNTER — Encounter: Admitting: Student
